# Patient Record
Sex: Female | Born: 1970 | Race: Black or African American | Hispanic: No | Marital: Single | Smoking: Never smoker
Health system: Southern US, Community
[De-identification: ages and names within clinical notes are randomized; demographics above are authoritative.]

## PROBLEM LIST (undated history)

## (undated) DIAGNOSIS — M069 Rheumatoid arthritis, unspecified: Secondary | ICD-10-CM

## (undated) DIAGNOSIS — I10 Essential (primary) hypertension: Secondary | ICD-10-CM

## (undated) HISTORY — PX: ABDOMINAL HYSTERECTOMY: SHX81

---

## 2015-08-09 ENCOUNTER — Emergency Department (INDEPENDENT_AMBULATORY_CARE_PROVIDER_SITE_OTHER)
Admission: EM | Admit: 2015-08-09 | Discharge: 2015-08-09 | Disposition: A | Discharge status: Home / Self Care | Payer: BLUE CROSS/BLUE SHIELD | Source: Home / Self Care | Attending: Family Medicine | Admitting: Family Medicine

## 2015-08-09 ENCOUNTER — Emergency Department (INDEPENDENT_AMBULATORY_CARE_PROVIDER_SITE_OTHER): Payer: BLUE CROSS/BLUE SHIELD

## 2015-08-09 ENCOUNTER — Encounter: Payer: Self-pay | Admitting: Emergency Medicine

## 2015-08-09 DIAGNOSIS — M7061 Trochanteric bursitis, right hip: Secondary | ICD-10-CM

## 2015-08-09 DIAGNOSIS — M545 Low back pain, unspecified: Secondary | ICD-10-CM

## 2015-08-09 HISTORY — DX: Essential (primary) hypertension: I10

## 2015-08-09 LAB — POCT URINALYSIS DIP (MANUAL ENTRY)
BILIRUBIN UA: NEGATIVE
BILIRUBIN UA: NEGATIVE
Glucose, UA: NEGATIVE
Leukocytes, UA: NEGATIVE
Nitrite, UA: NEGATIVE
PH UA: 7
PROTEIN UA: NEGATIVE
RBC UA: NEGATIVE
SPEC GRAV UA: 1.02
Urobilinogen, UA: 1

## 2015-08-09 MED ORDER — MELOXICAM 15 MG PO TABS: 15.0000 mg | ORAL_TABLET | Freq: Every day | ORAL | Status: AC

## 2015-08-09 MED ORDER — PREDNISONE 20 MG PO TABS: ORAL_TABLET | ORAL | Status: DC

## 2015-08-09 NOTE — Discharge Instructions (Signed)
Apply ice pack right hip and lower back for 20 to 30 minutes, 3 to 4 times daily  Continue until pain decreases.  Begin range of motion and stretching exercises as tolerated.  May begin Mobic (meloxicam) after finishing prednisone. Consider trying daily dose of Miralax for about 5 days.   Hip Bursitis Bursitis is a swelling and soreness (inflammation) of a fluid-filled sac (bursa). This sac overlies and protects the joints.  CAUSES   Injury.  Overuse of the muscles surrounding the joint.  Arthritis.  Gout.  Infection.  Cold weather.  Inadequate warm-up and conditioning prior to activities. The cause may not be known.  SYMPTOMS   Mild to severe irritation.  Tenderness and swelling over the outside of the hip.  Pain with motion of the hip.  If the bursa becomes infected, a fever may be present. Redness, tenderness, and warmth will develop over the hip. Symptoms usually lessen in 3 to 4 weeks with treatment, but can come back. TREATMENT If conservative treatment does not work, your caregiver may advise draining the bursa and injecting cortisone into the area. This may speed up the healing process. This may also be used as an initial treatment of choice. HOME CARE INSTRUCTIONS   Apply ice to the affected area for 15-20 minutes every 3 to 4 hours while awake for the first 2 days. Put the ice in a plastic bag and place a towel between the bag of ice and your skin.  Rest the painful joint as much as possible, but continue to put the joint through a normal range of motion at least 4 times per day. When the pain lessens, begin normal, slow movements and usual activities to help prevent stiffness of the hip.  Only take over-the-counter or prescription medicines for pain, discomfort, or fever as directed by your caregiver.  Use crutches to limit weight bearing on the hip joint, if advised.  Elevate your painful hip to reduce swelling. Use pillows for propping and cushioning your legs  and hips.  Gentle massage may provide comfort and decrease swelling. SEEK IMMEDIATE MEDICAL CARE IF:   Your pain increases even during treatment, or you are not improving.  You have a fever.  You have heat and inflammation over the involved bursa.  You have any other questions or concerns. MAKE SURE YOU:   Understand these instructions.  Will watch your condition.  Will get help right away if you are not doing well or get worse.   This information is not intended to replace advice given to you by your health care provider. Make sure you discuss any questions you have with your health care provider.   Document Released: 09/17/2001 Document Revised: 06/20/2011 Document Reviewed: 10/28/2014 Elsevier Interactive Patient Education [employer]

## 2015-08-09 NOTE — ED Provider Notes (Signed)
CSN: [id number]     Arrival date & time 08/09/15  1629 History   First MD Initiated Contact with Patient 08/09/15 1736     Chief Complaint  Patient presents with  . Abdominal Pain      HPI Comments: Patient complains of one week history of dull pain in her right back that radiates to her right abdomen.  The pain is worse with movement.  She recalls no injury.  No GI or GU symptoms. Past history of C-section and hysterectomy.  Patient is a 45 y.o. female presenting with back pain. The history is provided by the patient.  Back Pain Location:  Lumbar spine Quality:  Aching Radiates to: abdomen. Pain severity:  Mild Pain is:  Same all the time Onset quality:  Sudden Duration:  1 week Timing:  Constant Progression:  Unchanged Chronicity:  New Context: not lifting heavy objects, not MVA, not occupational injury, not physical stress and not recent injury   Relieved by:  Nothing Worsened by:  Movement Ineffective treatments:  None tried Associated symptoms: abdominal pain   Associated symptoms: no abdominal swelling, no bladder incontinence, no bowel incontinence, no chest pain, no dysuria, no fever, no leg pain, no numbness, no paresthesias, no pelvic pain, no perianal numbness, no tingling and no weakness   Risk factors: obesity     Past Medical History  Diagnosis Date  . Hypertension    Past Surgical History  Procedure Laterality Date  . Abdominal hysterectomy     No family history on file. Social History  Substance Use Topics  . Smoking status: Never Smoker   . Smokeless tobacco: None  . Alcohol Use: No   OB History    No data available     Review of Systems  Constitutional: Negative for fever.  Cardiovascular: Negative for chest pain.  Gastrointestinal: Positive for abdominal pain. Negative for bowel incontinence.  Genitourinary: Negative for bladder incontinence, dysuria and pelvic pain.  Musculoskeletal: Positive for back pain.  Neurological: Negative for  tingling, weakness, numbness and paresthesias.    Allergies  Bactrim  Home Medications   Prior to Admission medications   Medication Sig Start Date End Date Taking? Authorizing Provider  AmLODIPine Besylate (NORVASC PO) Take by mouth.   Yes Historical Provider, MD  PANTOPRAZOLE SODIUM PO Take by mouth.   Yes Historical Provider, MD  meloxicam (MOBIC) 15 MG tablet Take 1 tablet (15 mg total) by mouth daily. Take with food each morning 08/09/15   Lattie Haw, MD  predniSONE (DELTASONE) 20 MG tablet Take one tab by mouth twice daily for 5 days, then one daily for 3 days. Take with food. 08/09/15   Lattie Haw, MD   Meds Ordered and Administered this Visit  Medications - No data to display  BP 98/65 mmHg  Pulse 75  Temp(Src) 98.5 F (36.9 C) (Oral)  Ht 5\' 2"  (1.575 m)  Wt 210 lb (95.255 kg)  BMI 38.40 kg/m2  SpO2 98% No data found.   Physical Exam  Constitutional: She is oriented to person, place, and time. She appears well-developed and well-nourished. No distress.  Patient is obese (BMI 38.4)  HENT:  Head: Normocephalic.  Mouth/Throat: Oropharynx is clear and moist.  Eyes: Pupils are equal, round, and reactive to light.  Neck: Normal range of motion.  Cardiovascular: Normal heart sounds.   Pulmonary/Chest: Breath sounds normal.  Abdominal: Soft. Bowel sounds are normal. There is no hepatosplenomegaly. There is generalized tenderness. There is no rigidity, no rebound and  no guarding.  Palpation of abdomen reveals vague diffuse tenderness.  Musculoskeletal: She exhibits no edema.       Right hip: She exhibits tenderness and bony tenderness. She exhibits normal range of motion, normal strength, no swelling, no crepitus, no deformity and no laceration.       Lumbar back: She exhibits tenderness. She exhibits no bony tenderness and no swelling.       Back:       Legs: Right hip reveals distinct tenderness over the greater trochanter.  Palpating the greater trochanter  during resisted lateral abduction of the hip recreates her pain.   Back:  Range of motion relatively well preserved.  Can heel/toe walk and squat without difficulty.   Tenderness in the midline from L4 to S1  Straight leg raising test is negative.  Sitting knee extension test is negative.  Strength and sensation in the lower extremities is normal.  Patellar and achilles reflexes are normal.  FABER lateralizes to right lower back.     Neurological: She is alert and oriented to person, place, and time.  Skin: Skin is warm and dry. No rash noted.  Nursing note and vitals reviewed.   ED Course  Procedures none    Labs Reviewed  POCT URINALYSIS DIP (MANUAL ENTRY) negative    Imaging Review Dg Lumbar Spine Complete  08/09/2015  CLINICAL DATA:  Right-sided lower back pain for 1 week without known injury. EXAM: LUMBAR SPINE - COMPLETE 4+ VIEW COMPARISON:  None. FINDINGS: There is no evidence of lumbar spine fracture. Alignment is normal. Intervertebral disc spaces are maintained. IMPRESSION: Normal lumbar spine. Electronically Signed   By: Lupita Raider, M.D.   On: 08/09/2015 18:23       MDM   1. Trochanteric bursitis of right hip   2. Midline low back pain without sciatica    Begin prednisone burst/taper.   Apply ice pack right hip and lower back for 20 to 30 minutes, 3 to 4 times daily  Continue until pain decreases.  Begin range of motion and stretching exercises as tolerated.  May begin Mobic (meloxicam) after finishing prednisone.  Review of lumbar spine films:  Limited view of abdomen reveals moderate stool load.  Patient notes that she has taken Linzess recently.  Recommend that she consider trying daily dose of Miralax for about 5 days.  Followup with Dr. Rodney Langton or Dr. Clementeen [facility] ([facility]) if not improving about two weeks.     Lattie Haw, MD 08/13/15 [phone]

## 2015-08-09 NOTE — ED Notes (Signed)
Pt c/o left sided abdominal pain, slight itching, no exposure to STD, no dysuria, no hematuria.

## 2015-08-11 ENCOUNTER — Telehealth: Payer: Self-pay | Admitting: *Deleted

## 2017-07-03 IMAGING — DX DG LUMBAR SPINE COMPLETE 4+V
5 series · 5 of 5 positions shown · non-contrast
Comparison: None.

CLINICAL DATA: Right-sided lower back pain for 1 week without known
injury.

EXAM:
LUMBAR SPINE - COMPLETE 4+ VIEW

[l-spine ap]
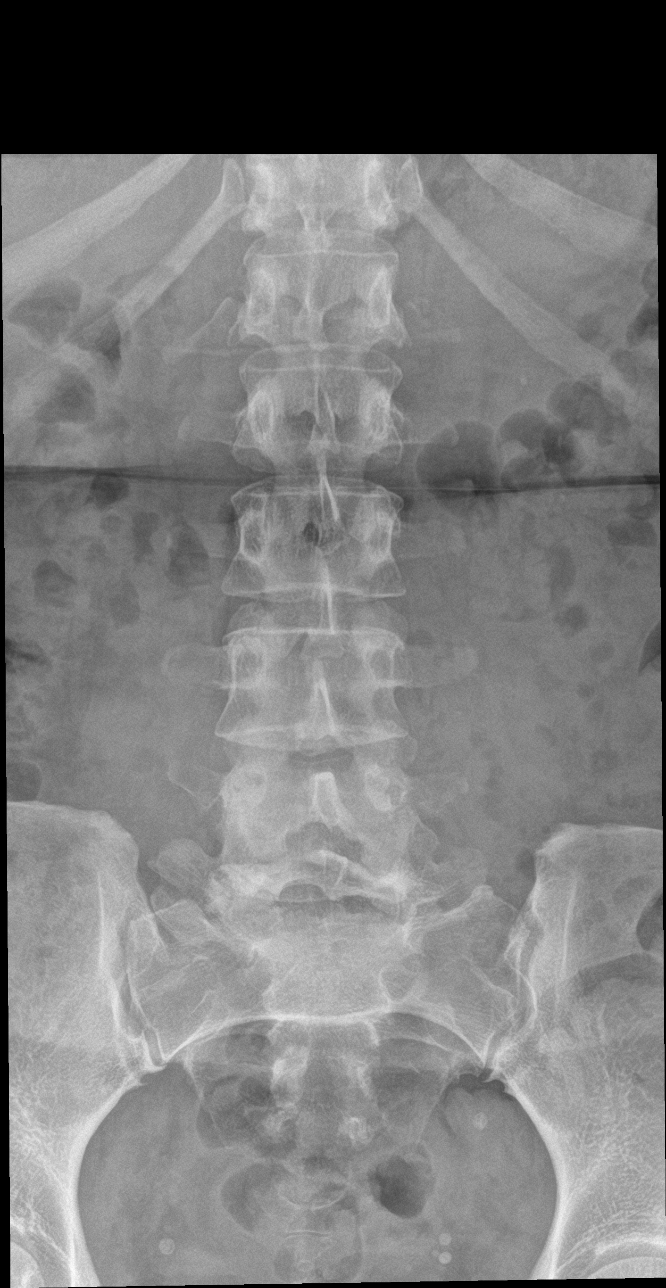

[l-spine obl (1 of 2)]
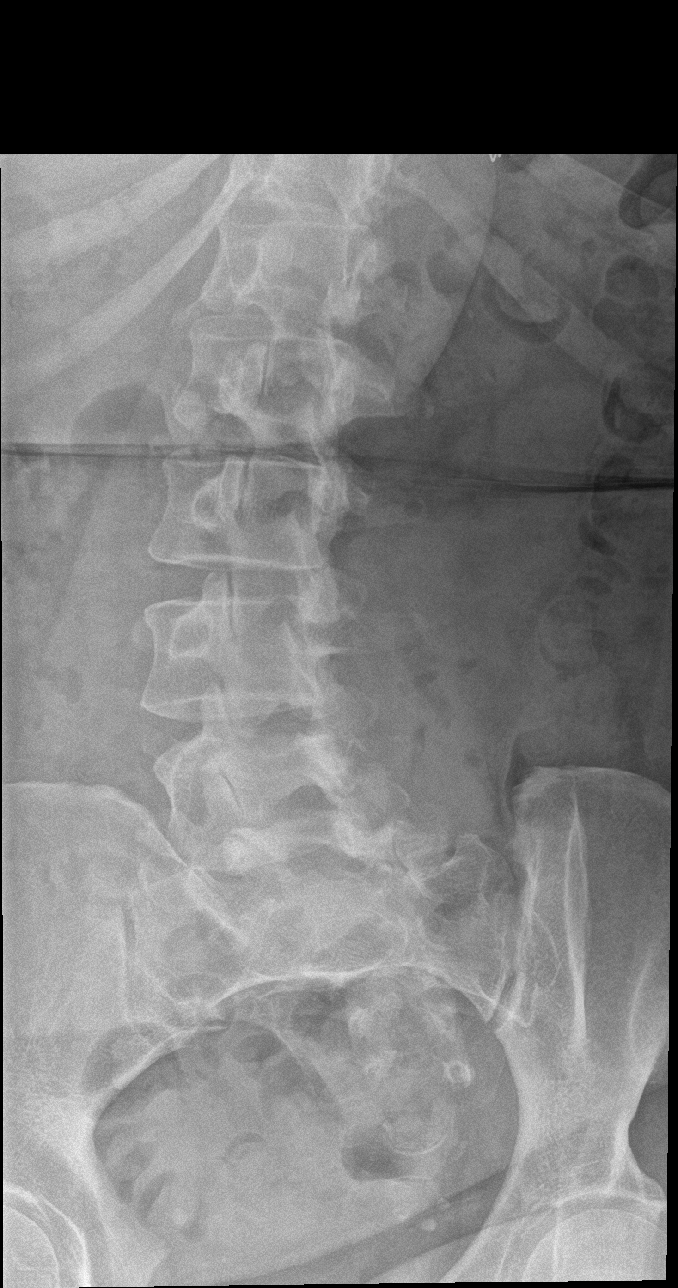

[l-spine obl (2 of 2)]
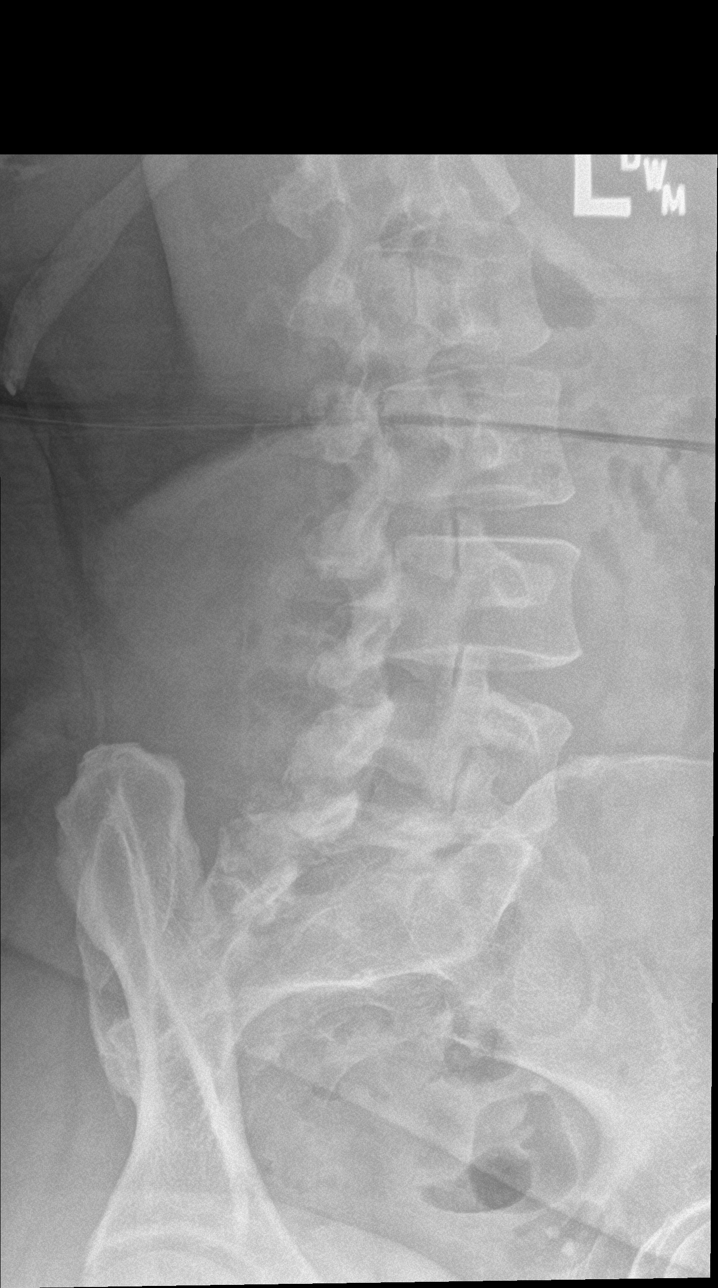

[l-spine lat]
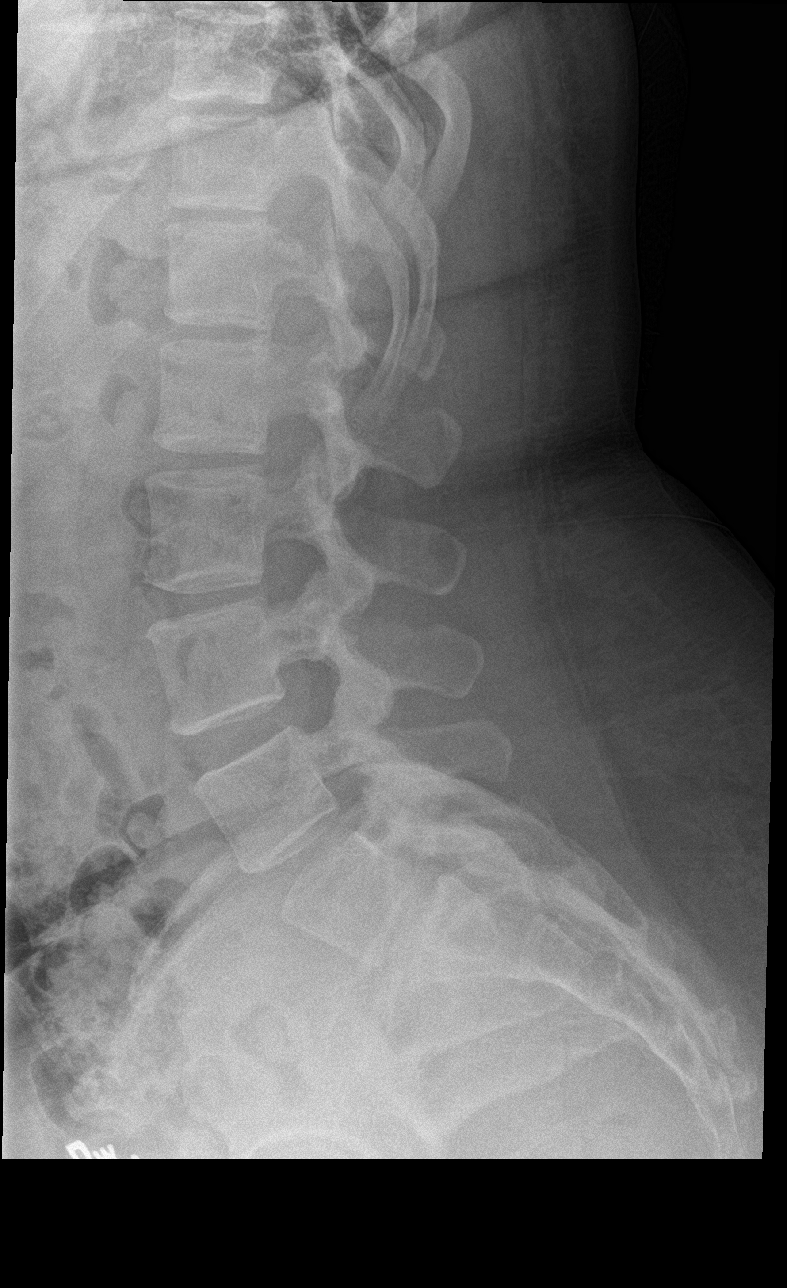

[l-spine spot]
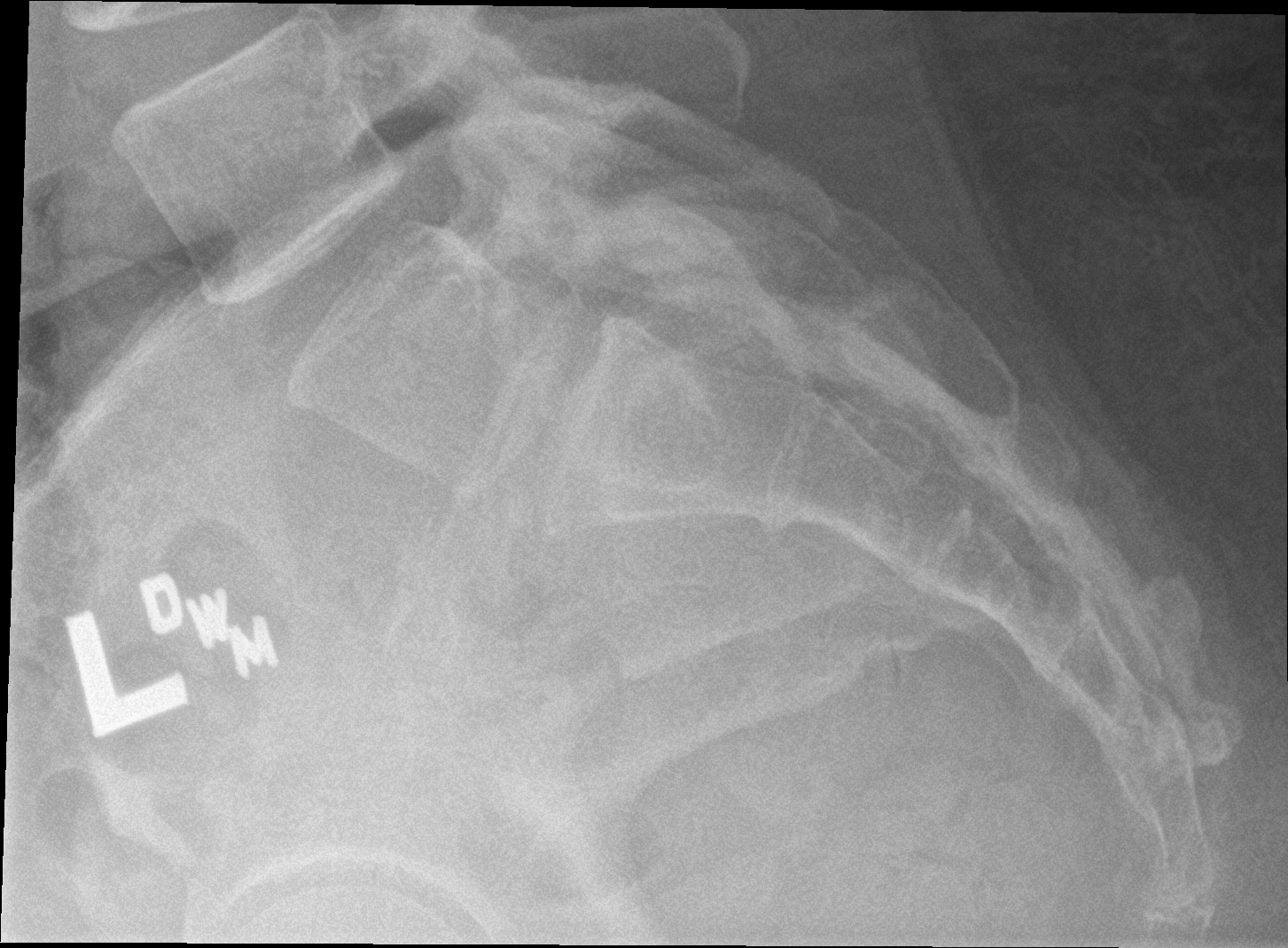

[5 of 5 positions shown; findings below may reference images not displayed]

FINDINGS: There is no evidence of lumbar spine fracture. Alignment is normal.
Intervertebral disc spaces are maintained.
IMPRESSION: Normal lumbar spine.

## 2017-10-08 ENCOUNTER — Encounter (HOSPITAL_COMMUNITY): Payer: Self-pay

## 2017-10-08 ENCOUNTER — Inpatient Hospital Stay (HOSPITAL_COMMUNITY)
Admission: EM | Admit: 2017-10-08 | Discharge: 2017-10-10 | DRG: 871 | Disposition: A | Discharge status: Home / Self Care | Payer: BLUE CROSS/BLUE SHIELD | Attending: Internal Medicine | Admitting: Internal Medicine

## 2017-10-08 ENCOUNTER — Other Ambulatory Visit: Payer: Self-pay

## 2017-10-08 ENCOUNTER — Inpatient Hospital Stay (HOSPITAL_COMMUNITY): Payer: BLUE CROSS/BLUE SHIELD

## 2017-10-08 DIAGNOSIS — Z888 Allergy status to other drugs, medicaments and biological substances status: Secondary | ICD-10-CM

## 2017-10-08 DIAGNOSIS — I10 Essential (primary) hypertension: Secondary | ICD-10-CM | POA: Diagnosis present

## 2017-10-08 DIAGNOSIS — R6510 Systemic inflammatory response syndrome (SIRS) of non-infectious origin without acute organ dysfunction: Secondary | ICD-10-CM

## 2017-10-08 DIAGNOSIS — A419 Sepsis, unspecified organism: Secondary | ICD-10-CM | POA: Diagnosis not present

## 2017-10-08 DIAGNOSIS — Z79899 Other long term (current) drug therapy: Secondary | ICD-10-CM

## 2017-10-08 DIAGNOSIS — Z791 Long term (current) use of non-steroidal anti-inflammatories (NSAID): Secondary | ICD-10-CM

## 2017-10-08 DIAGNOSIS — M069 Rheumatoid arthritis, unspecified: Secondary | ICD-10-CM | POA: Diagnosis present

## 2017-10-08 DIAGNOSIS — J181 Lobar pneumonia, unspecified organism: Secondary | ICD-10-CM

## 2017-10-08 DIAGNOSIS — Z7952 Long term (current) use of systemic steroids: Secondary | ICD-10-CM

## 2017-10-08 DIAGNOSIS — J189 Pneumonia, unspecified organism: Secondary | ICD-10-CM | POA: Diagnosis present

## 2017-10-08 DIAGNOSIS — R05 Cough: Secondary | ICD-10-CM | POA: Diagnosis not present

## 2017-10-08 DIAGNOSIS — E876 Hypokalemia: Secondary | ICD-10-CM | POA: Diagnosis present

## 2017-10-08 DIAGNOSIS — Z882 Allergy status to sulfonamides status: Secondary | ICD-10-CM

## 2017-10-08 DIAGNOSIS — Z7951 Long term (current) use of inhaled steroids: Secondary | ICD-10-CM

## 2017-10-08 DIAGNOSIS — Z90710 Acquired absence of both cervix and uterus: Secondary | ICD-10-CM

## 2017-10-08 HISTORY — DX: Rheumatoid arthritis, unspecified: M06.9

## 2017-10-08 LAB — CBC WITH DIFFERENTIAL/PLATELET
Basophils Absolute: 0 10*3/uL (ref 0.0–0.1)
Basophils Relative: 0 %
EOS ABS: 0.2 10*3/uL (ref 0.0–0.7)
Eosinophils Relative: 3 %
HEMATOCRIT: 37.6 % (ref 36.0–46.0)
HEMOGLOBIN: 12 g/dL (ref 12.0–15.0)
LYMPHS ABS: 0.7 10*3/uL (ref 0.7–4.0)
LYMPHS PCT: 11 %
MCH: 28.6 pg (ref 26.0–34.0)
MCHC: 31.9 g/dL (ref 30.0–36.0)
MCV: 89.7 fL (ref 78.0–100.0)
Monocytes Absolute: 0.3 10*3/uL (ref 0.1–1.0)
Monocytes Relative: 4 %
NEUTROS ABS: 5.6 10*3/uL (ref 1.7–7.7)
NEUTROS PCT: 82 %
Platelets: 207 10*3/uL (ref 150–400)
RBC: 4.19 MIL/uL (ref 3.87–5.11)
RDW: 13 % (ref 11.5–15.5)
WBC: 6.9 10*3/uL (ref 4.0–10.5)

## 2017-10-08 LAB — URINALYSIS, ROUTINE W REFLEX MICROSCOPIC
BILIRUBIN URINE: NEGATIVE
GLUCOSE, UA: NEGATIVE mg/dL
HGB URINE DIPSTICK: NEGATIVE
Ketones, ur: NEGATIVE mg/dL
Leukocytes, UA: NEGATIVE
NITRITE: NEGATIVE
PH: 6 (ref 5.0–8.0)
Protein, ur: NEGATIVE mg/dL
SPECIFIC GRAVITY, URINE: 1.016 (ref 1.005–1.030)

## 2017-10-08 LAB — BASIC METABOLIC PANEL
Anion gap: 7 (ref 5–15)
BUN: 9 mg/dL (ref 6–20)
CHLORIDE: 102 mmol/L (ref 98–111)
CO2: 30 mmol/L (ref 22–32)
Calcium: 8.5 mg/dL — ABNORMAL LOW (ref 8.9–10.3)
Creatinine, Ser: 0.94 mg/dL (ref 0.44–1.00)
GFR calc Af Amer: >60 mL/min (ref >60)
GFR calc non Af Amer: >60 mL/min (ref >60)
Glucose, Bld: 101 mg/dL — ABNORMAL HIGH (ref 70–99)
POTASSIUM: 2.7 mmol/L — AB (ref 3.5–5.1)
SODIUM: 139 mmol/L (ref 135–145)

## 2017-10-08 LAB — I-STAT TROPONIN, ED: Troponin i, poc: 0 ng/mL (ref 0.00–0.08)

## 2017-10-08 LAB — I-STAT CG4 LACTIC ACID, ED: LACTIC ACID, VENOUS: 0.69 mmol/L (ref 0.5–1.9)

## 2017-10-08 LAB — I-STAT BETA HCG BLOOD, ED (MC, WL, AP ONLY): I-stat hCG, quantitative: <5 m[IU]/mL (ref <5)

## 2017-10-08 MED ORDER — ACETAMINOPHEN 325 MG PO TABS
650.0000 mg | ORAL_TABLET | Freq: Once | ORAL | Status: AC | PRN
  Administered 2017-10-08: 650 mg via ORAL
  Filled 2017-10-08: qty 2

## 2017-10-08 MED ORDER — SODIUM CHLORIDE 0.9 % IV SOLN
500.0000 mg | Freq: Once | INTRAVENOUS | Status: AC
  Administered 2017-10-08: 500 mg via INTRAVENOUS
  Filled 2017-10-08: qty 500

## 2017-10-08 MED ORDER — SODIUM CHLORIDE 0.9 % IV BOLUS
1000.0000 mL | Freq: Once | INTRAVENOUS | Status: AC
  Administered 2017-10-08: 1000 mL via INTRAVENOUS

## 2017-10-08 MED ORDER — SODIUM CHLORIDE 0.9 % IV SOLN
1.0000 g | Freq: Once | INTRAVENOUS | Status: AC
  Administered 2017-10-08: 1 g via INTRAVENOUS
  Filled 2017-10-08: qty 10

## 2017-10-08 NOTE — ED Notes (Signed)
Date and time results received: 10/08/17 2349 (use smartphrase ".now" to insert current time)  Test: K+ Critical Value: 2.7  Name of Provider Notified: Sharen Heck  Orders Received? Or Actions Taken?: none

## 2017-10-08 NOTE — ED Triage Notes (Signed)
Pt reports generalized body aches, cough, congestion and malaise x2 weeks. She reports that she has been on an oral antibiotic over the last week (finished yesterday), but does not feel any better. Pt also reports hypertension despite her medication. A&Ox4. Ambulatory.

## 2017-10-08 NOTE — ED Provider Notes (Signed)
[facility]-EMERGENCY DEPT Provider Note   CSN: [id number] Arrival date & time: 10/08/17  2006     History   Chief Complaint Chief Complaint  Patient presents with  . Generalized Body Aches  . Cough  . Hypertension    HPI Kaylee Washington is a 47 y.o. female with history of hypertension, rheumatoid arthritis on prednisone and methotrexate, seasonal allergies is here for persistent fever.  Associated with dry cough, generalized body aches, generalized weakness, headache, chills, central chest tightness that is worse with coughing and breathing.  Symptoms ongoing for the last couple of weeks.  Finished 10-day course of Levaquin yesterday for suspected pneumonia prescribed by PCP.  She has been compliant with these medicines.  No vision changes, neck pain or stiffness, exertional chest pain, nausea, vomiting, abdominal pain, dysuria, hematuria, changes in bowel movements.  States that she is noticed increased urinary frequency over the last couple days, she actually urinated on herself twice today because she can make it to the bathroom.  HPI  Past Medical History:  Diagnosis Date  . Hypertension     There are no active problems to display for this patient.   Past Surgical History:  Procedure Laterality Date  . ABDOMINAL HYSTERECTOMY       OB History   None      Home Medications    Prior to Admission medications   Medication Sig Start Date End Date Taking? Authorizing Provider  AmLODIPine Besylate (NORVASC PO) Take by mouth.    [provider]  meloxicam (MOBIC) 15 MG tablet Take 1 tablet (15 mg total) by mouth daily. Take with food each morning 08/09/15   Lattie Haw, MD  PANTOPRAZOLE SODIUM PO Take by mouth.    [provider]  predniSONE (DELTASONE) 20 MG tablet Take one tab by mouth twice daily for 5 days, then one daily for 3 days. Take with food. 08/09/15   Lattie Haw, MD    Family History History reviewed. No  pertinent family history.  Social History Social History   Tobacco Use  . Smoking status: Never Smoker  Substance Use Topics  . Alcohol use: No  . Drug use: Not on file     Allergies   Bactrim [sulfamethoxazole-trimethoprim]   Review of Systems Review of Systems  Constitutional: Positive for chills and fever.  HENT: Negative for congestion and sore throat.   Eyes: Negative for visual disturbance.  Respiratory: Positive for cough, chest tightness and shortness of breath.   Cardiovascular: Positive for chest pain.  Gastrointestinal: Negative for constipation, diarrhea, nausea and vomiting.  Genitourinary: Positive for frequency.  Musculoskeletal: Positive for myalgias.  Allergic/Immunologic: Positive for immunocompromised state.  All other systems reviewed and are negative.    Physical Exam Updated Vital Signs BP 96/70   Pulse 94   Temp (!) 100.9 F (38.3 C) (Rectal)   Resp (!) 22   SpO2 100%   Physical Exam  Constitutional: She is oriented to person, place, and time. She appears well-developed and well-nourished. No distress.  Sounds congested.  Nontoxic.  HENT:  Head: Normocephalic and atraumatic.  Nose: Nose normal.  Moist mucous membranes.  Oropharynx and tonsils normal.  Eyes: Pupils are equal, round, and reactive to light. Conjunctivae and EOM are normal.  Neck: Normal range of motion.  Cardiovascular: Regular rhythm. Tachycardia present.  2+ DP and radial pulses bilaterally. No LE edema.   Pulmonary/Chest: Effort normal and breath sounds normal. Tachypnea noted.  Cough with deep inspiration.  Looks clear, slightly decreased breath sounds to lower lobes anteriorly.  Abdominal: Soft. Bowel sounds are normal. There is no tenderness.  No G/R/R. No suprapubic or CVA tenderness. Negative Murphy's and McBurney's.   Musculoskeletal: Normal range of motion.  Neurological: She is alert and oriented to person, place, and time.  Skin: Skin is warm and dry. Capillary  refill takes less than 2 seconds.  Feels warm to touch.  Skin is dry.  Psychiatric: She has a normal mood and affect. Her behavior is normal.  Nursing note and vitals reviewed.    ED Treatments / Results  Labs (all labs ordered are listed, but only abnormal results are displayed) Labs Reviewed  BASIC METABOLIC PANEL - Abnormal; Notable for the following components:      Result Value   Potassium 2.7 (*)    Glucose, Bld 101 (*)    Calcium 8.5 (*)    All other components within normal limits  URINE CULTURE  CULTURE, BLOOD (ROUTINE X 2)  CULTURE, BLOOD (ROUTINE X 2)  CBC WITH DIFFERENTIAL/PLATELET  URINALYSIS, ROUTINE W REFLEX MICROSCOPIC  MAGNESIUM  I-STAT CG4 LACTIC ACID, ED  I-STAT TROPONIN, ED  I-STAT BETA HCG BLOOD, ED ([facility], [facility], [facility] ONLY)  I-STAT CG4 LACTIC ACID, ED    EKG None  Radiology Dg Chest 2 View  Result Date: 10/08/2017 CLINICAL DATA:  Cough and fever EXAM: CHEST - 2 VIEW COMPARISON:  None. FINDINGS: Streaky bibasilar left greater than right opacity. No pleural effusion. Normal heart size. No pneumothorax. IMPRESSION: Streaky left greater than right bibasilar opacity suggesting minimal pulmonary infiltrates. Electronically Signed   By: Jasmine Pang M.D.   On: 10/08/2017 22:58    Procedures Procedures (including critical care time)  Medications Ordered in ED Medications  sodium chloride 0.9 % bolus 1,000 mL (1,000 mLs Intravenous New Bag/Given 10/08/17 2336)  cefTRIAXone (ROCEPHIN) 1 g in sodium chloride 0.9 % 100 mL IVPB (1 g Intravenous New Bag/Given 10/08/17 2337)  azithromycin (ZITHROMAX) 500 mg in sodium chloride 0.9 % 250 mL IVPB (500 mg Intravenous New Bag/Given 10/08/17 2337)  potassium chloride 10 mEq in 100 mL IVPB (has no administration in time range)  potassium chloride SA (K-DUR,KLOR-CON) CR tablet 40 mEq (has no administration in time range)  magnesium sulfate (IV Push/IM) injection 2 g (has no administration in time range)  acetaminophen (TYLENOL)  tablet 650 mg (650 mg Oral Given 10/08/17 2239)     Initial Impression / Assessment and Plan / ED Course  I have reviewed the triage vital signs and the nursing notes.  Pertinent labs & imaging results that were available during my care of the patient were reviewed by me and considered in my medical decision making (see chart for details).  Clinical Course as of Oct 10 3  Sun Oct 08, 2017  2315 IMPRESSION: Streaky left greater than right bibasilar opacity suggesting minimal pulmonary infiltrates.    DG Chest 2 View [CG]  2317 Temp(!): 100.9 F (38.3 C) [CG]  2317 Pulse Rate(!): 109 [CG]  2317 Resp(!): 29 [CG]  2348 Potassium(!!): 2.7 [CG]    Clinical Course User Index [CG] Liberty Handy, PA-C   47 year old with persistent fever, cough, chest tightness refractory to 10 days of Levaquin.  She is immunocompromised.  Favoring CAP.  Less likely PE, ACS.   Has been febrile, tachycardic, tachypneic but overall well appearing.  Does not technically meet SIRS criteria with initial VS.  Sepsis code not indicated, may be developing early SIRS/sepsis. Normal oxygen saturation.  Pt admitted for CAP failed outpatient management, SIRS response, hypokalemia with flatter TW. IV abx, IVF, IV K, blood cultures ordered in ER. Pt shared with Dr. Clarene Duke.    Final Clinical Impressions(s) / ED Diagnoses   Final diagnoses:  Pneumonia of both lower lobes due to infectious organism (HCC)  Hypokalemia  SIRS (systemic inflammatory response syndrome) [facility])    ED Discharge Orders    None       Liberty Handy, PA-C 10/09/17 0005    Little, Ambrose Finland, MD 10/15/17 2020

## 2017-10-09 ENCOUNTER — Encounter (HOSPITAL_COMMUNITY): Payer: Self-pay | Admitting: Internal Medicine

## 2017-10-09 DIAGNOSIS — M069 Rheumatoid arthritis, unspecified: Secondary | ICD-10-CM | POA: Diagnosis present

## 2017-10-09 DIAGNOSIS — Z7951 Long term (current) use of inhaled steroids: Secondary | ICD-10-CM | POA: Diagnosis not present

## 2017-10-09 DIAGNOSIS — J189 Pneumonia, unspecified organism: Secondary | ICD-10-CM | POA: Diagnosis present

## 2017-10-09 DIAGNOSIS — Z791 Long term (current) use of non-steroidal anti-inflammatories (NSAID): Secondary | ICD-10-CM | POA: Diagnosis not present

## 2017-10-09 DIAGNOSIS — J181 Lobar pneumonia, unspecified organism: Secondary | ICD-10-CM | POA: Diagnosis not present

## 2017-10-09 DIAGNOSIS — E876 Hypokalemia: Secondary | ICD-10-CM | POA: Diagnosis present

## 2017-10-09 DIAGNOSIS — Z79899 Other long term (current) drug therapy: Secondary | ICD-10-CM | POA: Diagnosis not present

## 2017-10-09 DIAGNOSIS — Z888 Allergy status to other drugs, medicaments and biological substances status: Secondary | ICD-10-CM | POA: Diagnosis not present

## 2017-10-09 DIAGNOSIS — Z90710 Acquired absence of both cervix and uterus: Secondary | ICD-10-CM | POA: Diagnosis not present

## 2017-10-09 DIAGNOSIS — A419 Sepsis, unspecified organism: Secondary | ICD-10-CM | POA: Diagnosis present

## 2017-10-09 DIAGNOSIS — I10 Essential (primary) hypertension: Secondary | ICD-10-CM | POA: Diagnosis present

## 2017-10-09 DIAGNOSIS — Z882 Allergy status to sulfonamides status: Secondary | ICD-10-CM | POA: Diagnosis not present

## 2017-10-09 DIAGNOSIS — R05 Cough: Secondary | ICD-10-CM | POA: Diagnosis present

## 2017-10-09 DIAGNOSIS — Z7952 Long term (current) use of systemic steroids: Secondary | ICD-10-CM | POA: Diagnosis not present

## 2017-10-09 LAB — BASIC METABOLIC PANEL
Anion gap: 8 (ref 5–15)
BUN: 8 mg/dL (ref 6–20)
CHLORIDE: 101 mmol/L (ref 98–111)
CO2: 29 mmol/L (ref 22–32)
Calcium: 7.9 mg/dL — ABNORMAL LOW (ref 8.9–10.3)
Creatinine, Ser: 0.81 mg/dL (ref 0.44–1.00)
GFR calc non Af Amer: >60 mL/min (ref >60)
Glucose, Bld: 123 mg/dL — ABNORMAL HIGH (ref 70–99)
Potassium: 2.7 mmol/L — CL (ref 3.5–5.1)
SODIUM: 138 mmol/L (ref 135–145)

## 2017-10-09 LAB — RESPIRATORY PANEL BY PCR
ADENOVIRUS-RVPPCR: NOT DETECTED
Bordetella pertussis: NOT DETECTED
CORONAVIRUS NL63-RVPPCR: NOT DETECTED
CORONAVIRUS OC43-RVPPCR: NOT DETECTED
Chlamydophila pneumoniae: NOT DETECTED
Coronavirus 229E: NOT DETECTED
Coronavirus HKU1: NOT DETECTED
INFLUENZA A-RVPPCR: NOT DETECTED
Influenza B: NOT DETECTED
METAPNEUMOVIRUS-RVPPCR: NOT DETECTED
MYCOPLASMA PNEUMONIAE-RVPPCR: NOT DETECTED
PARAINFLUENZA VIRUS 1-RVPPCR: NOT DETECTED
PARAINFLUENZA VIRUS 2-RVPPCR: NOT DETECTED
PARAINFLUENZA VIRUS 3-RVPPCR: NOT DETECTED
PARAINFLUENZA VIRUS 4-RVPPCR: NOT DETECTED
Respiratory Syncytial Virus: NOT DETECTED
Rhinovirus / Enterovirus: NOT DETECTED

## 2017-10-09 LAB — STREP PNEUMONIAE URINARY ANTIGEN: Strep Pneumo Urinary Antigen: NEGATIVE

## 2017-10-09 LAB — INFLUENZA PANEL BY PCR (TYPE A & B)
INFLAPCR: NEGATIVE
Influenza B By PCR: NEGATIVE

## 2017-10-09 LAB — MAGNESIUM: Magnesium: 1.6 mg/dL — ABNORMAL LOW (ref 1.7–2.4)

## 2017-10-09 LAB — HIV ANTIBODY (ROUTINE TESTING W REFLEX): HIV SCREEN 4TH GENERATION: NONREACTIVE

## 2017-10-09 MED ORDER — AMITRIPTYLINE HCL 10 MG PO TABS
10.0000 mg | ORAL_TABLET | Freq: Every day | ORAL | Status: DC
  Administered 2017-10-09: 10 mg via ORAL
  Filled 2017-10-09: qty 1

## 2017-10-09 MED ORDER — MELOXICAM 15 MG PO TABS
15.0000 mg | ORAL_TABLET | Freq: Every day | ORAL | Status: DC
  Administered 2017-10-09 – 2017-10-10 (×2): 15 mg via ORAL
  Filled 2017-10-09 (×2): qty 1

## 2017-10-09 MED ORDER — MOMETASONE FURO-FORMOTEROL FUM 100-5 MCG/ACT IN AERO
2.0000 | INHALATION_SPRAY | Freq: Two times a day (BID) | RESPIRATORY_TRACT | Status: DC
  Administered 2017-10-09 – 2017-10-10 (×3): 2 via RESPIRATORY_TRACT
  Filled 2017-10-09: qty 8.8

## 2017-10-09 MED ORDER — POTASSIUM CHLORIDE CRYS ER 20 MEQ PO TBCR
40.0000 meq | EXTENDED_RELEASE_TABLET | Freq: Once | ORAL | Status: AC
  Administered 2017-10-09: 40 meq via ORAL
  Filled 2017-10-09: qty 2

## 2017-10-09 MED ORDER — CEFTRIAXONE SODIUM 1 G IJ SOLR
1.0000 g | INTRAMUSCULAR | Status: DC
  Administered 2017-10-09: 1 g via INTRAVENOUS
  Filled 2017-10-09: qty 1
  Filled 2017-10-09: qty 10

## 2017-10-09 MED ORDER — ALBUTEROL SULFATE (2.5 MG/3ML) 0.083% IN NEBU: 3.0000 mL | INHALATION_SOLUTION | RESPIRATORY_TRACT | Status: DC

## 2017-10-09 MED ORDER — AMLODIPINE BESYLATE 5 MG PO TABS: 10.0000 mg | ORAL_TABLET | Freq: Every day | ORAL | Status: DC

## 2017-10-09 MED ORDER — ENOXAPARIN SODIUM 40 MG/0.4ML ~~LOC~~ SOLN
40.0000 mg | SUBCUTANEOUS | Status: DC
  Administered 2017-10-09: 40 mg via SUBCUTANEOUS
  Filled 2017-10-09 (×3): qty 0.4

## 2017-10-09 MED ORDER — POTASSIUM CHLORIDE CRYS ER 20 MEQ PO TBCR
40.0000 meq | EXTENDED_RELEASE_TABLET | Freq: Two times a day (BID) | ORAL | Status: AC
  Administered 2017-10-09 (×2): 40 meq via ORAL
  Filled 2017-10-09 (×2): qty 2

## 2017-10-09 MED ORDER — HYDROCHLOROTHIAZIDE 25 MG PO TABS: 25.0000 mg | ORAL_TABLET | Freq: Every day | ORAL | Status: DC

## 2017-10-09 MED ORDER — PREDNISONE 5 MG PO TABS
5.0000 mg | ORAL_TABLET | Freq: Every day | ORAL | Status: DC
  Administered 2017-10-09 – 2017-10-10 (×2): 5 mg via ORAL
  Filled 2017-10-09 (×2): qty 1

## 2017-10-09 MED ORDER — AZITHROMYCIN 250 MG PO TABS
500.0000 mg | ORAL_TABLET | ORAL | Status: DC
  Administered 2017-10-09: 500 mg via ORAL
  Filled 2017-10-09: qty 2

## 2017-10-09 MED ORDER — ALBUTEROL SULFATE (2.5 MG/3ML) 0.083% IN NEBU: 2.5000 mg | INHALATION_SOLUTION | RESPIRATORY_TRACT | Status: DC | PRN

## 2017-10-09 MED ORDER — MAGNESIUM SULFATE 2 GM/50ML IV SOLN
2.0000 g | Freq: Once | INTRAVENOUS | Status: AC
  Administered 2017-10-09: 2 g via INTRAVENOUS
  Filled 2017-10-09: qty 50

## 2017-10-09 MED ORDER — ACETAMINOPHEN 325 MG PO TABS
650.0000 mg | ORAL_TABLET | Freq: Four times a day (QID) | ORAL | Status: DC | PRN
  Administered 2017-10-09: 650 mg via ORAL
  Filled 2017-10-09: qty 2

## 2017-10-09 MED ORDER — ALBUTEROL SULFATE (2.5 MG/3ML) 0.083% IN NEBU
3.0000 mL | INHALATION_SOLUTION | Freq: Four times a day (QID) | RESPIRATORY_TRACT | Status: DC
  Administered 2017-10-09: 3 mL via RESPIRATORY_TRACT
  Filled 2017-10-09: qty 3

## 2017-10-09 MED ORDER — MAGNESIUM SULFATE 50 % IJ SOLN: 2.0000 g | Freq: Once | INTRAMUSCULAR | Status: DC

## 2017-10-09 MED ORDER — POTASSIUM CHLORIDE 10 MEQ/100ML IV SOLN
10.0000 meq | Freq: Once | INTRAVENOUS | Status: AC
  Administered 2017-10-09: 10 meq via INTRAVENOUS
  Filled 2017-10-09: qty 100

## 2017-10-09 MED ORDER — FOLIC ACID 1 MG PO TABS
1.0000 mg | ORAL_TABLET | Freq: Every day | ORAL | Status: DC
  Administered 2017-10-09 – 2017-10-10 (×2): 1 mg via ORAL
  Filled 2017-10-09 (×2): qty 1

## 2017-10-09 MED ORDER — ALBUTEROL SULFATE (2.5 MG/3ML) 0.083% IN NEBU
3.0000 mL | INHALATION_SOLUTION | Freq: Two times a day (BID) | RESPIRATORY_TRACT | Status: DC
  Administered 2017-10-09 – 2017-10-10 (×2): 3 mL via RESPIRATORY_TRACT
  Filled 2017-10-09 (×2): qty 3

## 2017-10-09 NOTE — ED Notes (Signed)
ED TO INPATIENT HANDOFF REPORT  Name/Age/Gender Kaylee Washington 47 y.o. female  Code Status    Code Status Orders  (From admission, onward)        Start     Ordered   10/09/17 0011  Full code  Continuous     10/09/17 0018    Code Status History    This patient has a current code status but no historical code status.      Home/SNF/Other Home  Chief Complaint Generalized body aches;Cough;Hypertension  Level of Care/Admitting Diagnosis ED Disposition    ED Disposition Condition Comment   Admit  Hospital Area: [facility] [[id number]]  Level of Care: Med-Surg [16]  Diagnosis: CAP (community acquired pneumonia) [761607]  Admitting Physician: Etta Quill [phone]  Attending Physician: Etta Quill [[id number]  PT Class (Do Not Modify): Observation [104]  PT Acc Code (Do Not Modify): Observation [10022]       Medical History Past Medical History:  Diagnosis Date  . Hypertension   . RA (rheumatoid arthritis) (HCC)     Allergies Allergies  Allergen Reactions  . Nortriptyline Hcl Other (See Comments)    Dry Mouth  . Sulfamethoxazole-Trimethoprim Other (See Comments)    vomiting    IV Location/Drains/Wounds Patient Lines/Drains/Airways Status   Active Line/Drains/Airways    Name:   Placement date:   Placement time:   Site:   Days:   Peripheral IV 10/08/17 Left;Posterior Wrist   10/08/17    2335    Wrist   1          Labs/Imaging Results for orders placed or performed during the hospital encounter of 10/08/17 (from the past 48 hour(s))  Urinalysis, Routine w reflex microscopic     Status: None   Collection Time: 10/08/17 10:47 PM  Result Value Ref Range   Color, Urine YELLOW YELLOW   APPearance CLEAR CLEAR   Specific Gravity, Urine 1.016 1.005 - 1.030   pH 6.0 5.0 - 8.0   Glucose, UA NEGATIVE NEGATIVE mg/dL   Hgb urine dipstick NEGATIVE NEGATIVE   Bilirubin Urine NEGATIVE NEGATIVE   Ketones, ur NEGATIVE NEGATIVE mg/dL   Protein, ur  NEGATIVE NEGATIVE mg/dL   Nitrite NEGATIVE NEGATIVE   Leukocytes, UA NEGATIVE NEGATIVE    Comment: Performed at [facility], [place] [address], [place], [place] [address]  CBC with Differential     Status: None   Collection Time: 10/08/17 10:54 PM  Result Value Ref Range   WBC 6.9 4.0 - 10.5 K/uL   RBC 4.19 3.87 - 5.11 MIL/uL   Hemoglobin 12.0 12.0 - 15.0 g/dL   HCT 37.6 36.0 - 46.0 %   MCV 89.7 78.0 - 100.0 fL   MCH 28.6 26.0 - 34.0 pg   MCHC 31.9 30.0 - 36.0 g/dL   RDW 13.0 11.5 - 15.5 %   Platelets 207 150 - 400 K/uL   Neutrophils Relative % 82 %   Neutro Abs 5.6 1.7 - 7.7 K/uL   Lymphocytes Relative 11 %   Lymphs Abs 0.7 0.7 - 4.0 K/uL   Monocytes Relative 4 %   Monocytes Absolute 0.3 0.1 - 1.0 K/uL   Eosinophils Relative 3 %   Eosinophils Absolute 0.2 0.0 - 0.7 K/uL   Basophils Relative 0 %   Basophils Absolute 0.0 0.0 - 0.1 K/uL    Comment: Performed at [facility], [place] [address] [place],  [address]  Basic metabolic panel     Status: Abnormal  Collection Time: 10/08/17 10:54 PM  Result Value Ref Range   Sodium 139 135 - 145 mmol/L   Potassium 2.7 (LL) 3.5 - 5.1 mmol/L    Comment: CRITICAL RESULT CALLED TO, READ BACK BY AND VERIFIED WITH: JESSEE B. RN 2345 10/08/2017 HILL K    Chloride 102 98 - 111 mmol/L    Comment: Please note change in reference range.   CO2 30 22 - 32 mmol/L   Glucose, Bld 101 (H) 70 - 99 mg/dL    Comment: Please note change in reference range.   BUN 9 6 - 20 mg/dL    Comment: Please note change in reference range.   Creatinine, Ser 0.94 0.44 - 1.00 mg/dL   Calcium 8.5 (L) 8.9 - 10.3 mg/dL   GFR calc non Af Amer >60 >60 mL/min   GFR calc Af Amer >60 >60 mL/min    Comment: (NOTE) The eGFR has been calculated using the CKD EPI equation. This calculation has not been validated in all clinical situations. eGFR's persistently <60 mL/min signify possible Chronic Kidney Disease.    Anion gap 7 5 - 15     Comment: Performed at [facility], [place] [address], [place], [place] [address]  Magnesium     Status: Abnormal   Collection Time: 10/08/17 10:54 PM  Result Value Ref Range   Magnesium 1.6 (L) 1.7 - 2.4 mg/dL    Comment: Performed at [facility], [place] [address], [place], [place] [address]  I-Stat Beta hCG blood, ED ([facility], [facility], [facility] only)     Status: None   Collection Time: 10/08/17 11:12 PM  Result Value Ref Range   I-stat hCG, quantitative <5.0 <5 mIU/mL   Comment 3            Comment:   GEST. AGE      CONC.  (mIU/mL)   <=1 WEEK        5 - 50     2 WEEKS       50 - 500     3 WEEKS       100 - 10,000     4 WEEKS     1,000 - 30,000        FEMALE AND NON-PREGNANT FEMALE:     LESS THAN 5 mIU/mL   I-Stat Troponin, ED (not at [facility])     Status: None   Collection Time: 10/08/17 11:14 PM  Result Value Ref Range   Troponin i, poc 0.00 0.00 - 0.08 ng/mL   Comment 3            Comment: Due to the release kinetics of cTnI, a negative result within the first hours of the onset of symptoms does not rule out myocardial infarction with certainty. If myocardial infarction is still suspected, repeat the test at appropriate intervals.   I-Stat CG4 Lactic Acid, ED     Status: None   Collection Time: 10/08/17 11:15 PM  Result Value Ref Range   Lactic Acid, Venous 0.69 0.5 - 1.9 mmol/L   Dg Chest 2 View  Result Date: 10/08/2017 CLINICAL DATA:  Cough and fever EXAM: CHEST - 2 VIEW COMPARISON:  None. FINDINGS: Streaky bibasilar left greater than right opacity. No pleural effusion. Normal heart size. No pneumothorax. IMPRESSION: Streaky left greater than right bibasilar opacity suggesting minimal pulmonary infiltrates. Electronically Signed   By: Donavan Foil M.D.   On: 10/08/2017 22:58    Pending Labs Unresulted Labs (From admission, onward)  Start     Ordered   10/09/17 9024  Basic metabolic panel  Tomorrow morning,   R     10/09/17 0009   10/09/17 0010   Culture, sputum-assessment  Once,   R     10/09/17 0018   10/09/17 0010  Gram stain  Once,   R     10/09/17 0018   10/09/17 0010  HIV antibody (Routine Screening)  Once,   R     10/09/17 0018   10/09/17 0010  Strep pneumoniae urinary antigen  Once,   R     10/09/17 0018   10/09/17 0008  Respiratory Panel by PCR  (Respiratory virus panel)  Once,   R     10/09/17 0008   10/08/17 2241  Urine culture  STAT,   STAT     10/08/17 2240   10/08/17 2241  Blood culture (routine x 2)  BLOOD CULTURE X 2,   STAT     10/08/17 2240      Vitals/Pain Today's Vitals   10/08/17 2237 10/08/17 2313 10/08/17 2330 10/09/17 0000  BP:  (!) 110/58 (!) 118/52 96/70  Pulse:  100 99 94  Resp:  (!) 29 15 (!) 22  Temp:  (!) 100.9 F (38.3 C)    TempSrc:  Rectal    SpO2:  100% 100% 100%  PainSc: 7        Isolation Precautions Droplet precaution  Medications Medications  sodium chloride 0.9 % bolus 1,000 mL (1,000 mLs Intravenous New Bag/Given 10/08/17 2336)  azithromycin (ZITHROMAX) 500 mg in sodium chloride 0.9 % 250 mL IVPB (500 mg Intravenous New Bag/Given 10/08/17 2337)  potassium chloride 10 mEq in 100 mL IVPB (has no administration in time range)  potassium chloride SA (K-DUR,KLOR-CON) CR tablet 40 mEq (has no administration in time range)  magnesium sulfate IVPB 2 g 50 mL (has no administration in time range)  enoxaparin (LOVENOX) injection 40 mg (has no administration in time range)  cefTRIAXone (ROCEPHIN) 1 g in sodium chloride 0.9 % 100 mL IVPB (has no administration in time range)  azithromycin (ZITHROMAX) tablet 500 mg (has no administration in time range)  meloxicam (MOBIC) tablet 15 mg (has no administration in time range)  predniSONE (DELTASONE) tablet 5 mg (has no administration in time range)  amLODipine (NORVASC) tablet 10 mg (has no administration in time range)  amitriptyline (ELAVIL) tablet 10 mg (has no administration in time range)  albuterol (PROVENTIL HFA;VENTOLIN HFA) 108 (90  Base) MCG/ACT inhaler 1-2 puff (has no administration in time range)  mometasone-formoterol (DULERA) 100-5 MCG/ACT inhaler 2 puff (has no administration in time range)  folic acid (FOLVITE) tablet 1 mg (has no administration in time range)  hydrochlorothiazide (HYDRODIURIL) tablet 25 mg (has no administration in time range)  acetaminophen (TYLENOL) tablet 650 mg (650 mg Oral Given 10/08/17 2239)  cefTRIAXone (ROCEPHIN) 1 g in sodium chloride 0.9 % 100 mL IVPB (1 g Intravenous New Bag/Given 10/08/17 2337)    Mobility walks

## 2017-10-09 NOTE — Progress Notes (Signed)
CRITICAL VALUE ALERT  Critical Value: Potassium 2.7  Date & Time Notified: 10/09/2017  0630  Provider Notified: X. Blount, NP  Orders Received/Actions Taken: Provider to assess and address

## 2017-10-09 NOTE — Progress Notes (Addendum)
PROGRESS NOTE    Kaylee Washington  DEY:[id number] DOB: May 06, 1970 DOA: 10/08/2017 PCP: System, Provider Not In   Brief Narrative:  HPI on 10/09/2017 by Dr. Lyda Perone Kaylee Washington is a 47 y.o. female with medical history significant of RA, HTN.  Patient presents to the ED with c/o dry cough, fever, generalized body aches, headache.  Symptoms ongoing for the past couple of weeks.  Not improved despite course of outpt Levaquin.  For her RA she has been on MTX in the past.  She does report however that she hasnt been compliant with this med and hasnt taken it in at least several months (well before onset of any symptoms).  Assessment & Plan   Sepsis secondary to community Acquired pneumonia -Presented with fever, tachycardia, tachypnea -failed outpatient treatment with levaquin -CXR: Basilar opacity L>R -Respiratory viral panel unremarkable -Blood cultures pending  Rheumatoid arthritis -Patient has not taken methotrexate in many months, secondary to cost -Continue prednisone -Patient is on a biologic, her next injection is due on 7/17 -Patient follows her rheumatologist  Essential hypertension -Continue on amlodipine.  HCTZ held  Hypokalemia/hypomagnesemia -replace and monitor   DVT Prophylaxis  Lovenox  Code Status: Full  Family Communication: none at bedside  Disposition Plan: Admitted  Consultants None  Procedures  None   Antibiotics   Anti-infectives (From admission, onward)   Start     Dose/Rate Route Frequency Ordered Stop   10/09/17 2200  cefTRIAXone (ROCEPHIN) 1 g in sodium chloride 0.9 % 100 mL IVPB     1 g 200 mL/hr over 30 Minutes Intravenous Every 24 hours 10/09/17 0018 10/15/17 2159   10/09/17 2200  azithromycin (ZITHROMAX) tablet 500 mg     500 mg Oral Every 24 hours 10/09/17 0018 10/16/17 2159   10/08/17 2330  cefTRIAXone (ROCEPHIN) 1 g in sodium chloride 0.9 % 100 mL IVPB     1 g 200 mL/hr over 30 Minutes Intravenous  Once 10/08/17 2319 10/09/17 0027    10/08/17 2330  azithromycin (ZITHROMAX) 500 mg in sodium chloride 0.9 % 250 mL IVPB     500 mg 250 mL/hr over 60 Minutes Intravenous  Once 10/08/17 2319 10/09/17 0122      Subjective:   Kaylee Washington seen and examined today.  States she is feeling better.  Continues to have cough.  Denies any current chest pain, abdominal pain, nausea or vomiting, diarrhea or constipation.  Objective:   Vitals:   10/08/17 2330 10/09/17 0000 10/09/17 0158 10/09/17 0547  BP: (!) 118/52 96/70  (!) 112/58  Pulse: 99 94  81  Resp: 15 (!) 22  17  Temp:    97.7 F (36.5 C)  TempSrc:    Oral  SpO2: 100% 100%  95%  Weight:   104.3 kg (230 lb)   Height:   5\' 2"  (1.575 m)     Intake/Output Summary (Last 24 hours) at 10/09/2017 1430 Last data filed at 10/09/2017 1000 Gross per 24 hour  Intake 580 ml  Output -  Net 580 ml   Filed Weights   10/09/17 0158  Weight: 104.3 kg (230 lb)    Exam  General: Well developed, well nourished, NAD, appears stated age  HEENT: NCAT, mucous membranes moist.   Neck: Supple  Cardiovascular: S1 S2 auscultated, no rubs, murmurs or gallops. Regular rate and rhythm.  Respiratory: Clear to auscultation bilaterally with equal chest rise, no wheezing. Occ cough  Abdomen: Soft, obese, nontender, nondistended, + bowel sounds  Extremities: warm dry without  cyanosis clubbing or edema  Neuro: AAOx3, nonfocal  Skin: Without rashes exudates or nodules  Psych: Normal affect and demeanor with intact judgement and insight   Data Reviewed: I have personally reviewed following labs and imaging studies  CBC: Recent Labs  Lab 10/08/17 2254  WBC 6.9  NEUTROABS 5.6  HGB 12.0  HCT 37.6  MCV 89.7  PLT 207   Basic Metabolic Panel: Recent Labs  Lab 10/08/17 2254 10/09/17 0534  NA 139 138  K 2.7* 2.7*  CL 102 101  CO2 30 29  GLUCOSE 101* 123*  BUN 9 8  CREATININE 0.94 0.81  CALCIUM 8.5* 7.9*  MG 1.6*  --    GFR: Estimated Creatinine Clearance: 98.4 mL/min  (by C-G formula based on SCr of 0.81 mg/dL). Liver Function Tests: No results for input(s): AST, ALT, ALKPHOS, BILITOT, PROT, ALBUMIN in the last 168 hours. No results for input(s): LIPASE, AMYLASE in the last 168 hours. No results for input(s): AMMONIA in the last 168 hours. Coagulation Profile: No results for input(s): INR, PROTIME in the last 168 hours. Cardiac Enzymes: No results for input(s): CKTOTAL, CKMB, CKMBINDEX, TROPONINI in the last 168 hours. BNP (last 3 results) No results for input(s): PROBNP in the last 8760 hours. HbA1C: No results for input(s): HGBA1C in the last 72 hours. CBG: No results for input(s): GLUCAP in the last 168 hours. Lipid Profile: No results for input(s): CHOL, HDL, LDLCALC, TRIG, CHOLHDL, LDLDIRECT in the last 72 hours. Thyroid Function Tests: No results for input(s): TSH, T4TOTAL, FREET4, T3FREE, THYROIDAB in the last 72 hours. Anemia Panel: No results for input(s): VITAMINB12, FOLATE, FERRITIN, TIBC, IRON, RETICCTPCT in the last 72 hours. Urine analysis:    Component Value Date/Time   COLORURINE YELLOW 10/08/2017 2247   APPEARANCEUR CLEAR 10/08/2017 2247   LABSPEC 1.016 10/08/2017 2247   PHURINE 6.0 10/08/2017 2247   GLUCOSEU NEGATIVE 10/08/2017 2247   HGBUR NEGATIVE 10/08/2017 2247   BILIRUBINUR NEGATIVE 10/08/2017 2247   BILIRUBINUR negative 08/09/2015 1741   KETONESUR NEGATIVE 10/08/2017 2247   PROTEINUR NEGATIVE 10/08/2017 2247   UROBILINOGEN 1.0 08/09/2015 1741   NITRITE NEGATIVE 10/08/2017 2247   LEUKOCYTESUR NEGATIVE 10/08/2017 2247   Sepsis Labs: @LABRCNTIP (procalcitonin:4,lacticidven:4)  ) Recent Results (from the past 240 hour(s))  Respiratory Panel by PCR     Status: None   Collection Time: 10/09/17  9:47 AM  Result Value Ref Range Status   Adenovirus NOT DETECTED NOT DETECTED Final   Coronavirus 229E NOT DETECTED NOT DETECTED Final   Coronavirus HKU1 NOT DETECTED NOT DETECTED Final   Coronavirus NL63 NOT DETECTED NOT  DETECTED Final   Coronavirus OC43 NOT DETECTED NOT DETECTED Final   Metapneumovirus NOT DETECTED NOT DETECTED Final   Rhinovirus / Enterovirus NOT DETECTED NOT DETECTED Final   Influenza A NOT DETECTED NOT DETECTED Final   Influenza B NOT DETECTED NOT DETECTED Final   Parainfluenza Virus 1 NOT DETECTED NOT DETECTED Final   Parainfluenza Virus 2 NOT DETECTED NOT DETECTED Final   Parainfluenza Virus 3 NOT DETECTED NOT DETECTED Final   Parainfluenza Virus 4 NOT DETECTED NOT DETECTED Final   Respiratory Syncytial Virus NOT DETECTED NOT DETECTED Final   Bordetella pertussis NOT DETECTED NOT DETECTED Final   Chlamydophila pneumoniae NOT DETECTED NOT DETECTED Final   Mycoplasma pneumoniae NOT DETECTED NOT DETECTED Final    Comment: Performed at [facility], [address] [place], [place]      Radiology Studies: Dg Chest 2 View  Result Date: 10/08/2017 CLINICAL DATA:  Cough and fever EXAM: CHEST - 2 VIEW COMPARISON:  None. FINDINGS: Streaky bibasilar left greater than right opacity. No pleural effusion. Normal heart size. No pneumothorax. IMPRESSION: Streaky left greater than right bibasilar opacity suggesting minimal pulmonary infiltrates. Electronically Signed   By: Jasmine Pang M.D.   On: 10/08/2017 22:58     Scheduled Meds: . albuterol  3 mL Inhalation BID  . amitriptyline  10 mg Oral QHS  . azithromycin  500 mg Oral Q24H  . enoxaparin (LOVENOX) injection  40 mg Subcutaneous Q24H  . folic acid  1 mg Oral Daily  . meloxicam  15 mg Oral Q breakfast  . mometasone-formoterol  2 puff Inhalation BID  . potassium chloride  40 mEq Oral BID  . predniSONE  5 mg Oral Q breakfast   Continuous Infusions: . cefTRIAXone (ROCEPHIN)  IV       LOS: 0 days   Time Spent in minutes   30 minutes  Armando Bukhari D.O. on 10/09/2017 at 2:30 PM  Between 7am to 7pm - Pager - [phone]  After 7pm go to [email or web address] - password [email or web address]  And look for the night coverage person  covering for me after hours  [facility] Office  [phone]

## 2017-10-09 NOTE — H&P (Addendum)
History and Physical    Kaylee Washington WUJ:[id number] DOB: 09/23/1970 DOA: 10/08/2017  PCP: System, Provider Not In  Patient coming from: Home  I have personally briefly reviewed patient's old medical records in [facility]  Chief Complaint: Cough, SOB  HPI: Kaylee Washington is a 47 y.o. female with medical history significant of RA, HTN.  Patient presents to the ED with c/o dry cough, fever, generalized body aches, headache.  Symptoms ongoing for the past couple of weeks.  Not improved despite course of outpt Levaquin.  For her RA she has been on MTX in the past.  She does report however that she hasnt been compliant with this med and hasnt taken it in at least several months (well before onset of any symptoms).   ED Course: CXR shows B basilar streaky PNA.  Tm 100.9.  Given rocephin / azithro.   Review of Systems: As per HPI otherwise 10 point review of systems negative.   Past Medical History:  Diagnosis Date  . Hypertension   . RA (rheumatoid arthritis) (HCC)     Past Surgical History:  Procedure Laterality Date  . ABDOMINAL HYSTERECTOMY       reports that she has never smoked. She does not have any smokeless tobacco history on file. She reports that she does not drink alcohol. Her drug history is not on file.  Allergies  Allergen Reactions  . Nortriptyline Hcl Other (See Comments)    Dry Mouth  . Sulfamethoxazole-Trimethoprim Other (See Comments)    vomiting    History reviewed. No pertinent family history. No sick contacts.  Prior to Admission medications   Medication Sig Start Date End Date Taking? Authorizing Provider  Abatacept (ORENCIA IV) Inject into the vein every 30 (thirty) days.   Yes [provider]  albuterol (PROVENTIL HFA;VENTOLIN HFA) 108 (90 Base) MCG/ACT inhaler Inhale 1-2 puffs into the lungs every 4 (four) hours. 09/29/17  Yes [provider]  amitriptyline (ELAVIL) 10 MG tablet Take 10 mg by mouth daily. 09/27/17  Yes [provider]  amLODipine (NORVASC) 10 MG tablet Take 10 mg by mouth daily.    Yes [provider]  budesonide-formoterol (SYMBICORT) 80-4.5 MCG/ACT inhaler Inhale 2 puffs into the lungs 2 (two) times daily. 03/04/16  Yes [provider]  folic acid (FOLVITE) 1 MG tablet Take 1 mg by mouth daily. 07/25/16  Yes [provider]  hydrochlorothiazide (HYDRODIURIL) 25 MG tablet Take 25 mg by mouth daily. 10/07/17  Yes [provider]  meloxicam (MOBIC) 15 MG tablet Take 1 tablet (15 mg total) by mouth daily. Take with food each morning 08/09/15  Yes Lattie Haw, MD  PANTOPRAZOLE SODIUM PO Take 1 tablet by mouth as needed.    Yes [provider]  predniSONE (DELTASONE) 5 MG tablet Take 5 mg by mouth daily with breakfast.   Yes [provider]  levofloxacin (LEVAQUIN) 500 MG tablet Take 500 mg by mouth daily. 09/29/17   [provider]  predniSONE (DELTASONE) 20 MG tablet Take one tab by mouth twice daily for 5 days, then one daily for 3 days. Take with food. Patient not taking: Reported on 10/09/2017 08/09/15   Lattie Haw, MD    Physical Exam: Vitals:   10/08/17 2034 10/08/17 2313 10/08/17 2330 10/09/17 0000  BP: (!) 143/68 (!) 110/58 (!) 118/52 96/70  Pulse: (!) 109 100 99 94  Resp: 18 (!) 29 15 (!) 22  Temp: 100.2 F (37.9 C) (!) 100.9 F (  38.3 C)    TempSrc: Oral Rectal    SpO2: 94% 100% 100% 100%    Constitutional: NAD, calm, comfortable Eyes: PERRL, lids and conjunctivae normal ENMT: Mucous membranes are moist. Posterior pharynx clear of any exudate or lesions.Normal dentition. Nasal congestion. Neck: normal, supple, no masses, no thyromegaly Respiratory: Cough with deep inspiration. Cardiovascular: Regular rate and rhythm, no murmurs / rubs / gallops. No extremity edema. 2+ pedal pulses. No carotid bruits.  Abdomen: no tenderness, no masses palpated. No hepatosplenomegaly. Bowel sounds positive.  Musculoskeletal: no  clubbing / cyanosis. No joint deformity upper and lower extremities. Good ROM, no contractures. Normal muscle tone.  Skin: no rashes, lesions, ulcers. No induration Neurologic: CN 2-12 grossly intact. Sensation intact, DTR normal. Strength 5/5 in all 4.  Psychiatric: Normal judgment and insight. Alert and oriented x 3. Normal mood.    Labs on Admission: I have personally reviewed following labs and imaging studies  CBC: Recent Labs  Lab 10/08/17 2254  WBC 6.9  NEUTROABS 5.6  HGB 12.0  HCT 37.6  MCV 89.7  PLT 207   Basic Metabolic Panel: Recent Labs  Lab 10/08/17 2254  NA 139  K 2.7*  CL 102  CO2 30  GLUCOSE 101*  BUN 9  CREATININE 0.94  CALCIUM 8.5*   GFR: CrCl cannot be calculated (Unknown ideal weight.). Liver Function Tests: No results for input(s): AST, ALT, ALKPHOS, BILITOT, PROT, ALBUMIN in the last 168 hours. No results for input(s): LIPASE, AMYLASE in the last 168 hours. No results for input(s): AMMONIA in the last 168 hours. Coagulation Profile: No results for input(s): INR, PROTIME in the last 168 hours. Cardiac Enzymes: No results for input(s): CKTOTAL, CKMB, CKMBINDEX, TROPONINI in the last 168 hours. BNP (last 3 results) No results for input(s): PROBNP in the last 8760 hours. HbA1C: No results for input(s): HGBA1C in the last 72 hours. CBG: No results for input(s): GLUCAP in the last 168 hours. Lipid Profile: No results for input(s): CHOL, HDL, LDLCALC, TRIG, CHOLHDL, LDLDIRECT in the last 72 hours. Thyroid Function Tests: No results for input(s): TSH, T4TOTAL, FREET4, T3FREE, THYROIDAB in the last 72 hours. Anemia Panel: No results for input(s): VITAMINB12, FOLATE, FERRITIN, TIBC, IRON, RETICCTPCT in the last 72 hours. Urine analysis:    Component Value Date/Time   COLORURINE YELLOW 10/08/2017 2247   APPEARANCEUR CLEAR 10/08/2017 2247   LABSPEC 1.016 10/08/2017 2247   PHURINE 6.0 10/08/2017 2247   GLUCOSEU NEGATIVE 10/08/2017 2247   HGBUR  NEGATIVE 10/08/2017 2247   BILIRUBINUR NEGATIVE 10/08/2017 2247   BILIRUBINUR negative 08/09/2015 1741   KETONESUR NEGATIVE 10/08/2017 2247   PROTEINUR NEGATIVE 10/08/2017 2247   UROBILINOGEN 1.0 08/09/2015 1741   NITRITE NEGATIVE 10/08/2017 2247   LEUKOCYTESUR NEGATIVE 10/08/2017 2247    Radiological Exams on Admission: Dg Chest 2 View  Result Date: 10/08/2017 CLINICAL DATA:  Cough and fever EXAM: CHEST - 2 VIEW COMPARISON:  None. FINDINGS: Streaky bibasilar left greater than right opacity. No pleural effusion. Normal heart size. No pneumothorax. IMPRESSION: Streaky left greater than right bibasilar opacity suggesting minimal pulmonary infiltrates. Electronically Signed   By: Jasmine Pang M.D.   On: 10/08/2017 22:58    EKG: Independently reviewed.  Assessment/Plan Principal Problem:   CAP (community acquired pneumonia) Active Problems:   RA (rheumatoid arthritis) (HCC)   HTN (hypertension)    1. CAP - 1. PNA pathway 2. Rocephin / azithro 3. BCx pending 4. Respiratory viral pnl 2. RA - 1. Conveniently patient hasnt taken MTX  for many months, thus it is very unlikely that her presentation is due to MTX pulmonary toxicity / pneumonitis. 2. Will continue prednisone at home dose of 5mg  PO daily for now. 3. Watch for signs / symptoms of adrenal crisis, but she reports her BP has been running high at home despite taking BP meds.  Thus I dont think we need to do stress dose steroids at the moment. 4. Looks like she is on biologic, next injection not due till the 17th. 3. HTN - 1. BP 127/66 during my exam 2. Will Hold HCTZ, and amlodipine for now.   DVT prophylaxis: Lovenox Code Status: Full Family Communication: No family in room Disposition Plan: Home after admit Consults called: None Admission status: Admit to inpatient - IP status due to failed outpatient course of levaquin   18 DO [facility] Pager [phone] Only works nights!  If 7AM-7PM,  please contact the primary day team physician taking care of patient  [email or web address] Password [email or web address]  10/09/2017, 12:18 AM

## 2017-10-10 DIAGNOSIS — A419 Sepsis, unspecified organism: Principal | ICD-10-CM

## 2017-10-10 DIAGNOSIS — M069 Rheumatoid arthritis, unspecified: Secondary | ICD-10-CM

## 2017-10-10 DIAGNOSIS — I10 Essential (primary) hypertension: Secondary | ICD-10-CM

## 2017-10-10 DIAGNOSIS — E876 Hypokalemia: Secondary | ICD-10-CM

## 2017-10-10 LAB — BASIC METABOLIC PANEL
Anion gap: 6 (ref 5–15)
BUN: 10 mg/dL (ref 6–20)
CALCIUM: 7.8 mg/dL — AB (ref 8.9–10.3)
CO2: 27 mmol/L (ref 22–32)
CREATININE: 0.67 mg/dL (ref 0.44–1.00)
Chloride: 109 mmol/L (ref 98–111)
GFR calc Af Amer: >60 mL/min (ref >60)
GFR calc non Af Amer: >60 mL/min (ref >60)
Glucose, Bld: 104 mg/dL — ABNORMAL HIGH (ref 70–99)
Potassium: 3.6 mmol/L (ref 3.5–5.1)
Sodium: 142 mmol/L (ref 135–145)

## 2017-10-10 LAB — URINE CULTURE: Culture: NO GROWTH

## 2017-10-10 LAB — MAGNESIUM: Magnesium: 2.2 mg/dL (ref 1.7–2.4)

## 2017-10-10 MED ORDER — ALBUTEROL SULFATE HFA 108 (90 BASE) MCG/ACT IN AERS: 1.0000 | INHALATION_SPRAY | RESPIRATORY_TRACT | Status: AC | PRN

## 2017-10-10 MED ORDER — POTASSIUM CHLORIDE CRYS ER 20 MEQ PO TBCR
40.0000 meq | EXTENDED_RELEASE_TABLET | Freq: Once | ORAL | Status: AC
  Administered 2017-10-10: 40 meq via ORAL
  Filled 2017-10-10: qty 2

## 2017-10-10 MED ORDER — FLUCONAZOLE 150 MG PO TABS: 150.0000 mg | ORAL_TABLET | Freq: Once | ORAL | 1 refills | Status: AC

## 2017-10-10 MED ORDER — AZITHROMYCIN 500 MG PO TABS: ORAL_TABLET | ORAL | 0 refills | Status: AC

## 2017-10-10 MED ORDER — CEFUROXIME AXETIL 500 MG PO TABS: 500.0000 mg | ORAL_TABLET | Freq: Two times a day (BID) | ORAL | 0 refills | Status: AC

## 2017-10-10 NOTE — Discharge Summary (Signed)
Physician Discharge Summary  Kaylee Washington AJG:[id number] DOB: 1970/09/08 DOA: 10/08/2017  PCP: System, Provider Not In  Admit date: 10/08/2017 Discharge date: 10/10/2017  Time spent: 45 minutes  Recommendations for Outpatient Follow-up:  Patient will be discharged to home.  Patient will need to follow up with primary care provider within one week of discharge, repeat BMP and magnesium.  Patient should continue medications as prescribed.  Patient should follow a heart healthy diet.   Discharge Diagnoses:  Sepsis secondary to community Acquired pneumonia Rheumatoid arthritis Essential hypertension Hypokalemia/hypomagnesemia  Discharge Condition: Stable  Diet recommendation: heart healthy  Filed Weights   10/09/17 0158  Weight: 104.3 kg (230 lb)    History of present illness:  on 10/09/2017 by Dr. Dareen Piano Kaylee Washington a 47 y.o.femalewith medical history significant ofRA, HTN. Patient presents to the ED with c/o dry cough, fever, generalized body aches, headache. Symptoms ongoing for the past couple of weeks. Not improved despite course of outpt Levaquin.  For her RA she has been on MTX in the past. She does report however that she hasnt been compliant with this med and hasnt taken it in at least several months (well before onset of any symptoms).  Hospital Course:  Sepsis secondary to community Acquired pneumonia -Presented with fever, tachycardia, tachypnea -failed outpatient treatment with levaquin -CXR: Basilar opacity L>R -Respiratory viral panel unremarkable -Blood cultures pending -has been afebrile since admission -Strep pneumonia urine antigen negative -was placed on azithromycin and ceftriaxone and has improved -will discharge with azithromycin and ceftin -Diflucan prescription given as patient states she develops yeast infections with antibiotics  Rheumatoid arthritis -Patient has not taken methotrexate in many months, secondary to cost -Continue  prednisone -Patient is on a biologic, her next injection is due on 7/17 -Patient follows her rheumatologist  Essential hypertension -Continue on amlodipine.  HCTZ held, may resume on discharge  Hypokalemia/hypomagnesemia -Resolved with replacement -repeat in one week  Procedures: None  Consultations: None  Discharge Exam: Vitals:   10/10/17 0522 10/10/17 0825  BP: 115/64   Pulse: 80   Resp: 16   Temp: 97.8 F (36.6 C)   SpO2: 99% 95%   Patient states she is feeling better. Denies current chest pain, shortness of breath, abdominal pain, nausea, vomiting, diarrhea, constipation.    General: Well developed, well nourished, NAD, appears stated age  HEENT: NCAT, mucous membranes moist.  Neck: Supple  Cardiovascular: S1 S2 auscultated, no rubs, murmurs or gallops. Regular rate and rhythm.  Respiratory: Clear to auscultation bilaterally with equal chest rise. No wheezing. Occ cough  Abdomen: Soft, obese, nontender, nondistended, + bowel sounds  Extremities: warm dry without cyanosis clubbing or edema  Neuro: AAOx3, nonfocal  Psych: Normal affect and demeanor with intact judgement and insight, pleasant   Discharge Instructions Discharge Instructions    Discharge instructions   Complete by:  As directed    Patient will be discharged to home.  Patient will need to follow up with primary care provider within one week of discharge, repeat BMP and magnesium.  Patient should continue medications as prescribed.  Patient should follow a heart healthy diet.     Allergies as of 10/10/2017      Reactions   Nortriptyline Hcl Other (See Comments)   Dry Mouth   Sulfamethoxazole-trimethoprim Other (See Comments)   vomiting      Medication List    TAKE these medications   albuterol 108 (90 Base) MCG/ACT inhaler Commonly known as:  PROVENTIL HFA;VENTOLIN HFA Inhale 1-2 puffs into  the lungs every 4 (four) hours as needed for wheezing or shortness of breath. What changed:      when to take this  reasons to take this   amitriptyline 10 MG tablet Commonly known as:  ELAVIL Take 10 mg by mouth daily.   azithromycin 500 MG tablet Commonly known as:  ZITHROMAX Continuation of hospital course   cefUROXime 500 MG tablet Commonly known as:  CEFTIN Take 1 tablet (500 mg total) by mouth 2 (two) times daily for 5 days.   fluconazole 150 MG tablet Commonly known as:  DIFLUCAN Take 1 tablet (150 mg total) by mouth once for 1 dose.   folic acid 1 MG tablet Commonly known as:  FOLVITE Take 1 mg by mouth daily.   hydrochlorothiazide 25 MG tablet Commonly known as:  HYDRODIURIL Take 25 mg by mouth daily.   meloxicam 15 MG tablet Commonly known as:  MOBIC Take 1 tablet (15 mg total) by mouth daily. Take with food each morning   NORVASC 10 MG tablet Generic drug:  amLODipine Take 10 mg by mouth daily.   ORENCIA IV Inject into the vein every 30 (thirty) days.   PANTOPRAZOLE SODIUM PO Take 1 tablet by mouth as needed (reflux, heart burn).   predniSONE 5 MG tablet Commonly known as:  DELTASONE Take 5 mg by mouth daily with breakfast.   SYMBICORT 80-4.5 MCG/ACT inhaler Generic drug:  budesonide-formoterol Inhale 2 puffs into the lungs 2 (two) times daily.      Allergies  Allergen Reactions  . Nortriptyline Hcl Other (See Comments)    Dry Mouth  . Sulfamethoxazole-Trimethoprim Other (See Comments)    vomiting      The results of significant diagnostics from this hospitalization (including imaging, microbiology, ancillary and laboratory) are listed below for reference.    Significant Diagnostic Studies: Dg Chest 2 View  Result Date: 10/08/2017 CLINICAL DATA:  Cough and fever EXAM: CHEST - 2 VIEW COMPARISON:  None. FINDINGS: Streaky bibasilar left greater than right opacity. No pleural effusion. Normal heart size. No pneumothorax. IMPRESSION: Streaky left greater than right bibasilar opacity suggesting minimal pulmonary infiltrates.  Electronically Signed   By: Jasmine Pang M.D.   On: 10/08/2017 22:58    Microbiology: Recent Results (from the past 240 hour(s))  Urine culture     Status: None   Collection Time: 10/08/17 10:47 PM  Result Value Ref Range Status   Specimen Description   Final    URINE, RANDOM Performed at [facility], [address], [place], [place] [address]    Special Requests   Final    NONE Performed at [facility], [address], [place], [place] [address]    Culture   Final    NO GROWTH Performed at [facility], [address] [place], [place] [address]    Report Status 10/10/2017 FINAL  Final  Respiratory Panel by PCR     Status: None   Collection Time: 10/09/17  9:47 AM  Result Value Ref Range Status   Adenovirus NOT DETECTED NOT DETECTED Final   Coronavirus 229E NOT DETECTED NOT DETECTED Final   Coronavirus HKU1 NOT DETECTED NOT DETECTED Final   Coronavirus NL63 NOT DETECTED NOT DETECTED Final   Coronavirus OC43 NOT DETECTED NOT DETECTED Final   Metapneumovirus NOT DETECTED NOT DETECTED Final   Rhinovirus / Enterovirus NOT DETECTED NOT DETECTED Final   Influenza A NOT DETECTED NOT DETECTED Final   Influenza B NOT DETECTED NOT DETECTED  Final   Parainfluenza Virus 1 NOT DETECTED NOT DETECTED Final   Parainfluenza Virus 2 NOT DETECTED NOT DETECTED Final   Parainfluenza Virus 3 NOT DETECTED NOT DETECTED Final   Parainfluenza Virus 4 NOT DETECTED NOT DETECTED Final   Respiratory Syncytial Virus NOT DETECTED NOT DETECTED Final   Bordetella pertussis NOT DETECTED NOT DETECTED Final   Chlamydophila pneumoniae NOT DETECTED NOT DETECTED Final   Mycoplasma pneumoniae NOT DETECTED NOT DETECTED Final    Comment: Performed at [facility], [address], [place], [place] [address]     Labs: Basic Metabolic Panel: Recent Labs  Lab 10/08/17 2254 10/09/17 0534 10/10/17 0336  NA 139 138 142  K 2.7* 2.7* 3.6  CL 102 101 109  CO2 30  29 27   GLUCOSE 101* 123* 104*  BUN 9 8 10   CREATININE 0.94 0.81 0.67  CALCIUM 8.5* 7.9* 7.8*  MG 1.6*  --  2.2   Liver Function Tests: No results for input(s): AST, ALT, ALKPHOS, BILITOT, PROT, ALBUMIN in the last 168 hours. No results for input(s): LIPASE, AMYLASE in the last 168 hours. No results for input(s): AMMONIA in the last 168 hours. CBC: Recent Labs  Lab 10/08/17 2254  WBC 6.9  NEUTROABS 5.6  HGB 12.0  HCT 37.6  MCV 89.7  PLT 207   Cardiac Enzymes: No results for input(s): CKTOTAL, CKMB, CKMBINDEX, TROPONINI in the last 168 hours. BNP: BNP (last 3 results) No results for input(s): BNP in the last 8760 hours.  ProBNP (last 3 results) No results for input(s): PROBNP in the last 8760 hours.  CBG: No results for input(s): GLUCAP in the last 168 hours.     Signed:   [facility] 10/10/2017, 10:41 AM

## 2017-10-10 NOTE — Progress Notes (Signed)
Discharge instructions reviewed with patient. Patient verbalized understanding. Patient to be discharged via private vehicle. 

## 2017-10-14 LAB — CULTURE, BLOOD (ROUTINE X 2)
CULTURE: NO GROWTH
Culture: NO GROWTH

## 2018-12-30 IMAGING — CR DG CHEST 2V
2 series · 2 of 2 positions shown · non-contrast
Comparison: None.

CLINICAL DATA: Cough and fever

EXAM:
CHEST - 2 VIEW

[w chest pa]
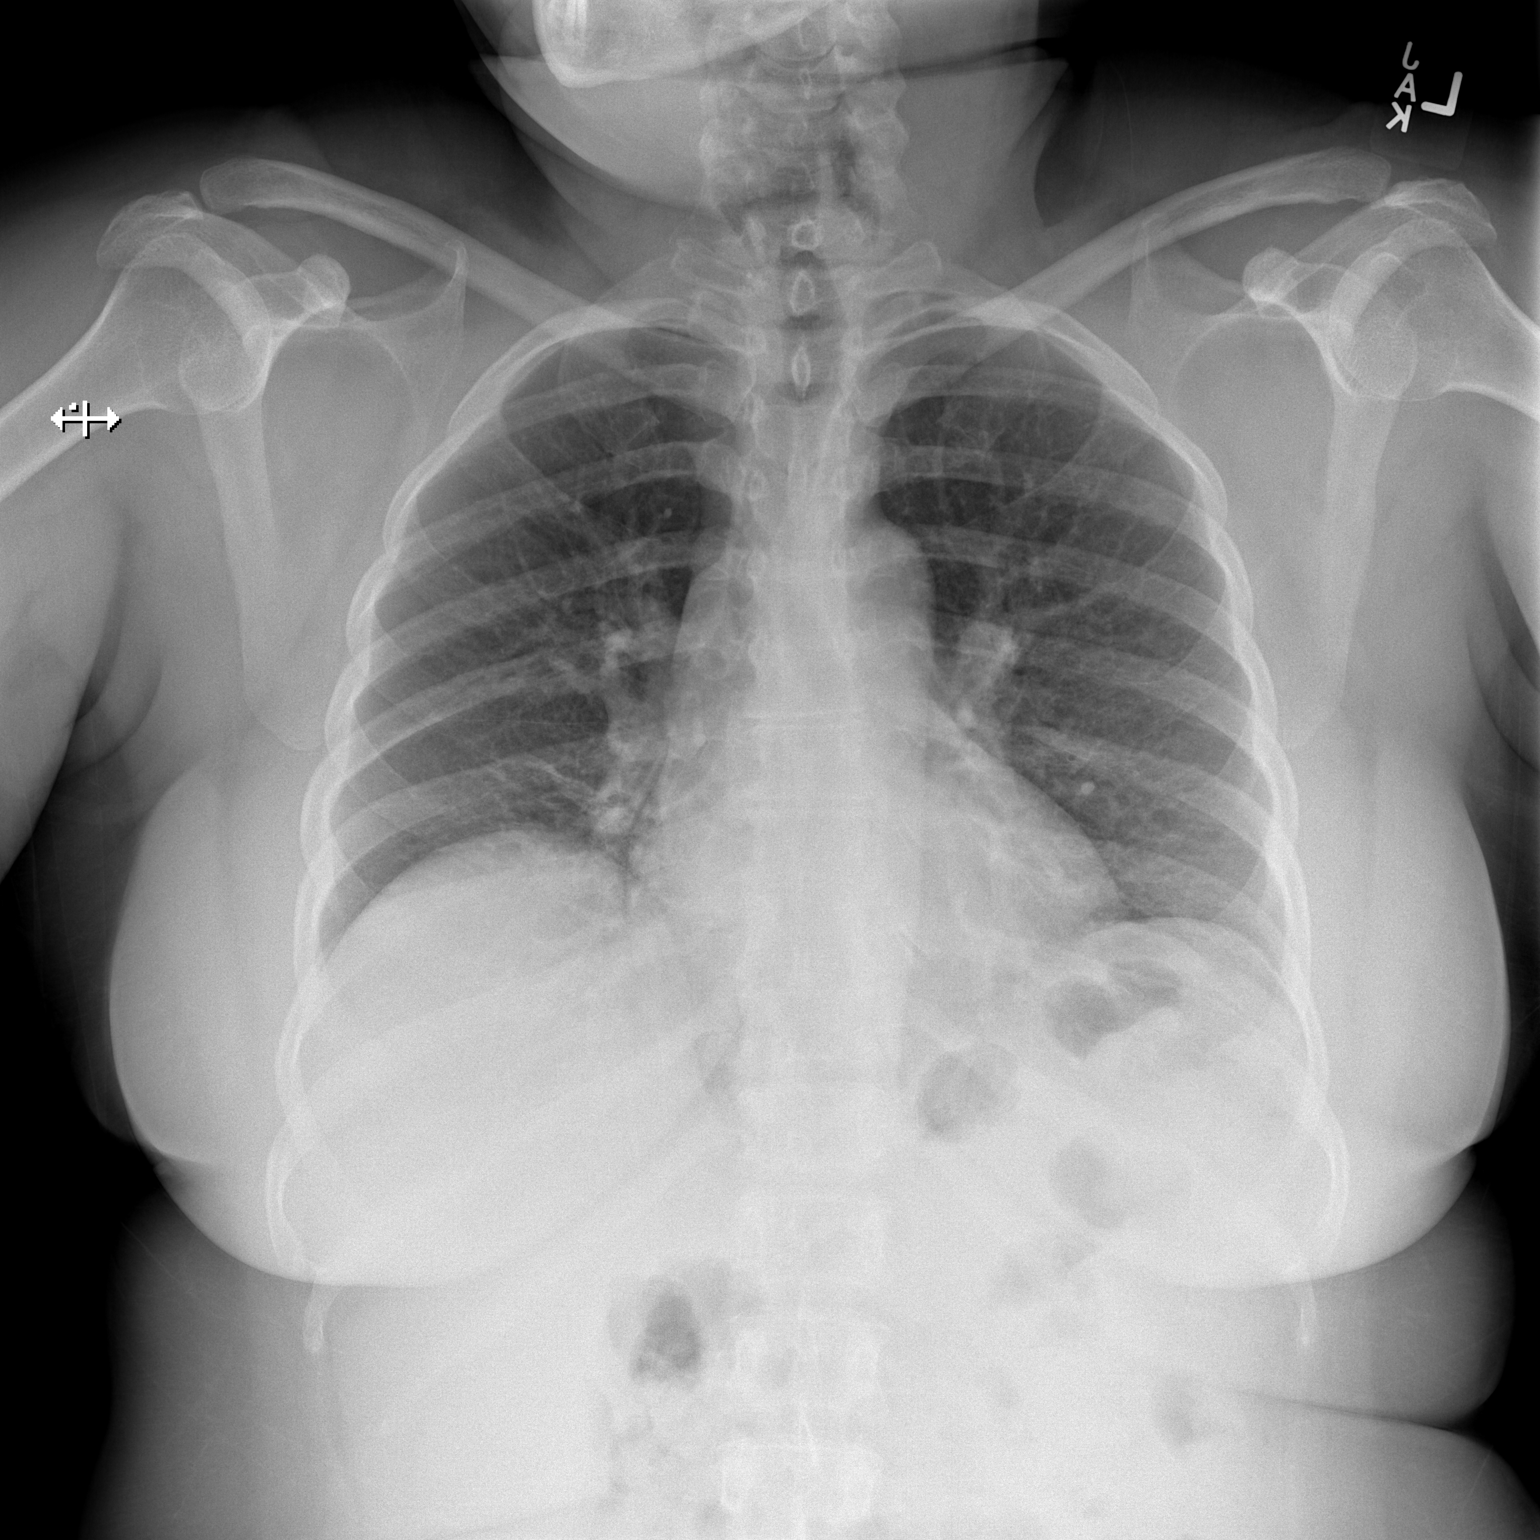

[w chest lat]
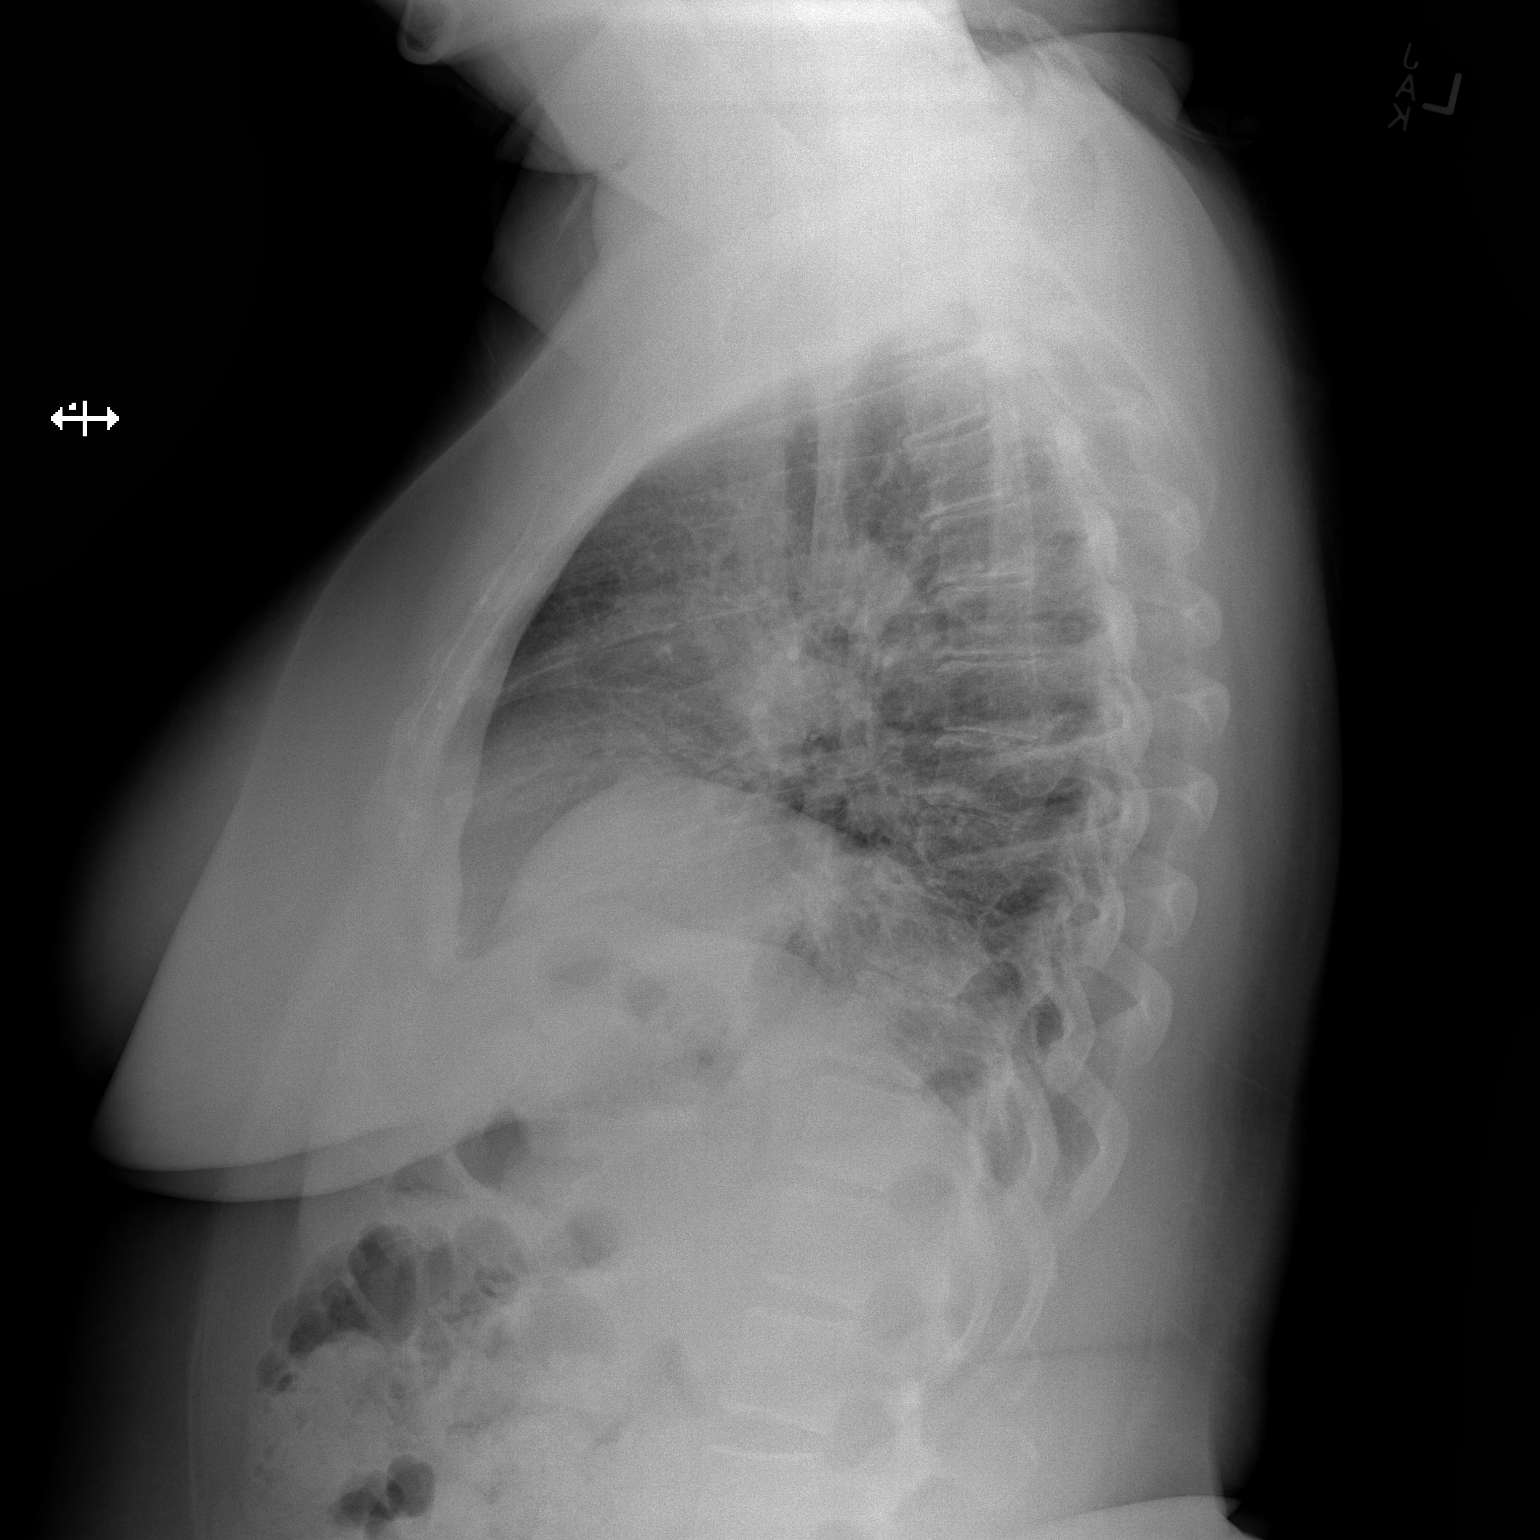

[2 of 2 positions shown; findings below may reference images not displayed]

FINDINGS: Streaky bibasilar left greater than right opacity. No pleural
effusion. Normal heart size. No pneumothorax.
IMPRESSION: Streaky left greater than right bibasilar opacity suggesting minimal
pulmonary infiltrates.

## 2019-11-23 ENCOUNTER — Ambulatory Visit: Payer: BLUE CROSS/BLUE SHIELD

## 2019-12-02 ENCOUNTER — Ambulatory Visit (HOSPITAL_COMMUNITY): Payer: Self-pay
